# Patient Record
Sex: Male | Born: 1945 | Race: Black or African American | Hispanic: No | Marital: Single | Smoking: Current some day smoker
Health system: Southern US, Community
[De-identification: ages and names within clinical notes are randomized; demographics above are authoritative.]

## PROBLEM LIST (undated history)

## (undated) DIAGNOSIS — E119 Type 2 diabetes mellitus without complications: Secondary | ICD-10-CM

## (undated) DIAGNOSIS — E785 Hyperlipidemia, unspecified: Secondary | ICD-10-CM

## (undated) DIAGNOSIS — K219 Gastro-esophageal reflux disease without esophagitis: Secondary | ICD-10-CM

## (undated) DIAGNOSIS — I10 Essential (primary) hypertension: Secondary | ICD-10-CM

---

## 2008-07-29 ENCOUNTER — Emergency Department: Payer: Self-pay | Admitting: Emergency Medicine

## 2018-01-23 ENCOUNTER — Telehealth: Payer: Self-pay | Admitting: *Deleted

## 2018-01-23 NOTE — Telephone Encounter (Signed)
Called pt r/t ldct screening program, pt reports that he only smokes cigars once and a while.  Patient reports NEVER smoking cigarettes.  Patient is ineligible for scan, pt voiced understanding.

## 2018-08-29 ENCOUNTER — Encounter: Payer: Self-pay | Admitting: Family Medicine

## 2018-09-12 ENCOUNTER — Encounter: Payer: Self-pay | Admitting: Gastroenterology

## 2018-09-12 ENCOUNTER — Other Ambulatory Visit: Payer: Self-pay

## 2018-09-12 ENCOUNTER — Ambulatory Visit (INDEPENDENT_AMBULATORY_CARE_PROVIDER_SITE_OTHER): Payer: Self-pay | Admitting: Gastroenterology

## 2018-09-12 ENCOUNTER — Encounter (INDEPENDENT_AMBULATORY_CARE_PROVIDER_SITE_OTHER): Payer: Self-pay

## 2018-09-12 VITALS — BP 160/67 | HR 88 | Temp 98.1°F | Ht 65.0 in | Wt 182.0 lb

## 2018-09-12 DIAGNOSIS — E785 Hyperlipidemia, unspecified: Secondary | ICD-10-CM | POA: Insufficient documentation

## 2018-09-12 DIAGNOSIS — B351 Tinea unguium: Secondary | ICD-10-CM | POA: Insufficient documentation

## 2018-09-12 DIAGNOSIS — R748 Abnormal levels of other serum enzymes: Secondary | ICD-10-CM

## 2018-09-12 DIAGNOSIS — D649 Anemia, unspecified: Secondary | ICD-10-CM | POA: Insufficient documentation

## 2018-09-12 DIAGNOSIS — E875 Hyperkalemia: Secondary | ICD-10-CM | POA: Insufficient documentation

## 2018-09-12 DIAGNOSIS — D500 Iron deficiency anemia secondary to blood loss (chronic): Secondary | ICD-10-CM

## 2018-09-12 DIAGNOSIS — M7060 Trochanteric bursitis, unspecified hip: Secondary | ICD-10-CM | POA: Insufficient documentation

## 2018-09-12 DIAGNOSIS — I10 Essential (primary) hypertension: Secondary | ICD-10-CM | POA: Insufficient documentation

## 2018-09-12 DIAGNOSIS — M549 Dorsalgia, unspecified: Secondary | ICD-10-CM | POA: Insufficient documentation

## 2018-09-12 DIAGNOSIS — E669 Obesity, unspecified: Secondary | ICD-10-CM | POA: Insufficient documentation

## 2018-09-12 DIAGNOSIS — E119 Type 2 diabetes mellitus without complications: Secondary | ICD-10-CM | POA: Insufficient documentation

## 2018-09-12 NOTE — Progress Notes (Signed)
Gastroenterology Consultation  Referring Provider:     Marguerita Merles, MD Primary Care Physician:  Marguerita Merles, MD Primary Gastroenterologist:  Dr. Allen Norris     Reason for Consultation:     Anemia and abnormal liver enzymes        HPI:   Dale Jacobs is a 73 y.o. y/o male referred for consultation & management of anemia and abnormal liver enzymes by Dr. Lennox Grumbles, Connye Burkitt, MD.  This patient comes in today after being found to have a hemoglobin of 6.8 with a low MCV.  The patient was also noted to have an AST of 42 with the other liver enzymes being normal. The patient thinks he has had a colonoscopy in the past and states he says it was in somebody's office but cannot remember which office or the doctor's name.  He denies any black stools or bloody stools he also denies any hematuria.  The patient has not had any constipation or unexplained weight loss.  He does report that he believes one of his medications has caused his stools to be looser than they had been in the past. He denies any alcohol use or abuse.  He also denies any history of having abnormal liver enzymes in the past.  The patient has not had a work-up for abnormal liver enzymes in the past as per him.  He also denies any abdominal pain.  History reviewed. No pertinent past medical history.  History reviewed. No pertinent surgical history.  Prior to Admission medications   Not on File    History reviewed. No pertinent family history.   Social History   Tobacco Use  . Smoking status: Current Every Day Smoker  . Smokeless tobacco: Never Used  Substance Use Topics  . Alcohol use: Yes  . Drug use: Never    Allergies as of 09/12/2018 - Review Complete 09/12/2018  Allergen Reaction Noted  . Tradjenta [linagliptin]  09/12/2018    Review of Systems:    All systems reviewed and negative except where noted in HPI.   Physical Exam:  BP (!) 160/67   Pulse 88   Temp 98.1 F (36.7 C) (Oral)   Ht 5\' 5"  (1.651 m)   Wt  182 lb (82.6 kg)   BMI 30.29 kg/m  No LMP for male patient. General:   Alert,  Well-developed, well-nourished, pleasant and cooperative in NAD Head:  Normocephalic and atraumatic. Eyes:  Sclera clear, no icterus.   Conjunctiva pink. Ears:  Normal auditory acuity. Nose:  No deformity, discharge, or lesions. Mouth:  No deformity or lesions,oropharynx pink & moist. Neck:  Supple; no masses or thyromegaly. Lungs:  Respirations even and unlabored.  Clear throughout to auscultation.   No wheezes, crackles, or rhonchi. No acute distress. Heart:  Regular rate and rhythm; no murmurs, clicks, rubs, or gallops. Abdomen:  Normal bowel sounds.  No bruits.  Soft, non-tender and non-distended without masses, hepatosplenomegaly or hernias noted.  No guarding or rebound tenderness.  Negative Carnett sign.   Rectal:  Deferred.  Msk:  Symmetrical without gross deformities.  Good, equal movement & strength bilaterally. Pulses:  Normal pulses noted. Extremities:  No clubbing or edema.  No cyanosis. Neurologic:  Alert and oriented x3;  grossly normal neurologically. Skin:  Intact without significant lesions or rashes.  No jaundice. Lymph Nodes:  No significant cervical adenopathy. Psych:  Alert and cooperative. Normal mood and affect.  Imaging Studies: No results found.  Assessment and Plan:   Jaceyon  AKASH WINSKI is a 73 y.o. y/o male who comes in today with a history of anemia with a low MCV.  The patient is now on iron supplementation.  The patient will be set up for an EGD and colonoscopy due to his anemia.  The patient will also have lab sent off for possible causes of his abnormal liver enzymes.  He will also have a right upper quadrant ultrasound ordered to rule out fatty liver disease.  The patient denies any alcohol abuse and his liver enzymes showed an isolated increased AST.  The patient will be contacted with the results of his liver work-up and will follow-up at the time of the EGD and colonoscopy. I  have discussed risks & benefits which include, but are not limited to, bleeding, infection, perforation & drug reaction.  The patient agrees with this plan & written consent will be obtained.     Lucilla Lame, MD. Marval Regal    Note: This dictation was prepared with Dragon dictation along with smaller phrase technology. Any transcriptional errors that result from this process are unintentional.

## 2018-09-13 LAB — ANA: Anti Nuclear Antibody (ANA): NEGATIVE

## 2018-09-13 LAB — HEPATITIS A ANTIBODY, TOTAL: hep A Total Ab: POSITIVE — AB

## 2018-09-13 LAB — IRON AND TIBC
Iron Saturation: 8 % — CL (ref 15–55)
Iron: 34 ug/dL — ABNORMAL LOW (ref 38–169)
Total Iron Binding Capacity: 411 ug/dL (ref 250–450)
UIBC: 377 ug/dL — ABNORMAL HIGH (ref 111–343)

## 2018-09-13 LAB — CERULOPLASMIN: Ceruloplasmin: 24.2 mg/dL (ref 16.0–31.0)

## 2018-09-13 LAB — ANTI-SMOOTH MUSCLE ANTIBODY, IGG: Smooth Muscle Ab: 12 Units (ref 0–19)

## 2018-09-13 LAB — HEPATIC FUNCTION PANEL
ALT: 25 IU/L (ref 0–44)
AST: 37 IU/L (ref 0–40)
Albumin: 4.4 g/dL (ref 3.7–4.7)
Alkaline Phosphatase: 83 IU/L (ref 39–117)
Bilirubin Total: 0.3 mg/dL (ref 0.0–1.2)
Bilirubin, Direct: 0.12 mg/dL (ref 0.00–0.40)
Total Protein: 7.1 g/dL (ref 6.0–8.5)

## 2018-09-13 LAB — HEPATITIS B SURFACE ANTIGEN: Hepatitis B Surface Ag: NEGATIVE

## 2018-09-13 LAB — HEPATITIS B SURFACE ANTIBODY,QUALITATIVE: Hep B Surface Ab, Qual: NONREACTIVE

## 2018-09-13 LAB — ALPHA-1-ANTITRYPSIN: A-1 Antitrypsin: 120 mg/dL (ref 101–187)

## 2018-09-13 LAB — FERRITIN: Ferritin: 18 ng/mL — ABNORMAL LOW (ref 30–400)

## 2018-09-13 LAB — MITOCHONDRIAL ANTIBODIES: Mitochondrial Ab: <20 Units (ref 0.0–20.0)

## 2018-09-13 LAB — HEPATITIS C ANTIBODY: Hep C Virus Ab: 0.1 s/co ratio (ref 0.0–0.9)

## 2018-09-14 ENCOUNTER — Telehealth: Payer: Self-pay | Admitting: Gastroenterology

## 2018-09-14 NOTE — Telephone Encounter (Signed)
Patient called l/m on v/m that someone called him. I returned his call & per Sharyn Lull let him know to go for the Covid testing at [facility] on 09-29-18 between 1030-1230 to have the test. The patient wanted me to give all information to his sister.

## 2018-09-14 NOTE — Telephone Encounter (Signed)
Patient called stating he has a colonoscopy scheduled for 10-03-18 with Dr Allen Norris and someone called him about the covid test.

## 2018-09-28 ENCOUNTER — Ambulatory Visit: Payer: Self-pay | Admitting: Gastroenterology

## 2018-09-29 ENCOUNTER — Other Ambulatory Visit: Payer: Self-pay

## 2018-09-29 ENCOUNTER — Other Ambulatory Visit
Admission: RE | Admit: 2018-09-29 | Discharge: 2018-09-29 | Disposition: A | Discharge status: Home / Self Care | Payer: Medicare Other | Source: Ambulatory Visit | Attending: Gastroenterology | Admitting: Gastroenterology

## 2018-09-29 DIAGNOSIS — Z01812 Encounter for preprocedural laboratory examination: Secondary | ICD-10-CM | POA: Diagnosis present

## 2018-09-29 DIAGNOSIS — Z1159 Encounter for screening for other viral diseases: Secondary | ICD-10-CM | POA: Diagnosis not present

## 2018-09-29 LAB — SARS CORONAVIRUS 2 (TAT 6-24 HRS): SARS Coronavirus 2: NEGATIVE

## 2018-10-02 ENCOUNTER — Encounter: Payer: Self-pay | Admitting: Emergency Medicine

## 2018-10-03 ENCOUNTER — Other Ambulatory Visit: Payer: Self-pay

## 2018-10-03 ENCOUNTER — Encounter: Payer: Self-pay | Admitting: *Deleted

## 2018-10-03 ENCOUNTER — Ambulatory Visit
Admission: RE | Admit: 2018-10-03 | Discharge: 2018-10-03 | Disposition: A | Discharge status: Home / Self Care | Payer: Medicare Other | Attending: Gastroenterology | Admitting: Gastroenterology

## 2018-10-03 ENCOUNTER — Ambulatory Visit: Payer: Medicare Other | Admitting: Anesthesiology

## 2018-10-03 ENCOUNTER — Encounter
Admission: RE | Disposition: A | Discharge status: Home / Self Care | Payer: Self-pay | Source: Home / Self Care | Attending: Gastroenterology

## 2018-10-03 DIAGNOSIS — Z888 Allergy status to other drugs, medicaments and biological substances status: Secondary | ICD-10-CM | POA: Insufficient documentation

## 2018-10-03 DIAGNOSIS — D490 Neoplasm of unspecified behavior of digestive system: Secondary | ICD-10-CM | POA: Diagnosis not present

## 2018-10-03 DIAGNOSIS — K317 Polyp of stomach and duodenum: Secondary | ICD-10-CM

## 2018-10-03 DIAGNOSIS — D500 Iron deficiency anemia secondary to blood loss (chronic): Secondary | ICD-10-CM | POA: Diagnosis not present

## 2018-10-03 DIAGNOSIS — D122 Benign neoplasm of ascending colon: Secondary | ICD-10-CM

## 2018-10-03 DIAGNOSIS — K635 Polyp of colon: Secondary | ICD-10-CM

## 2018-10-03 DIAGNOSIS — D509 Iron deficiency anemia, unspecified: Secondary | ICD-10-CM | POA: Insufficient documentation

## 2018-10-03 DIAGNOSIS — F17200 Nicotine dependence, unspecified, uncomplicated: Secondary | ICD-10-CM | POA: Diagnosis not present

## 2018-10-03 DIAGNOSIS — K641 Second degree hemorrhoids: Secondary | ICD-10-CM | POA: Insufficient documentation

## 2018-10-03 DIAGNOSIS — E119 Type 2 diabetes mellitus without complications: Secondary | ICD-10-CM | POA: Insufficient documentation

## 2018-10-03 DIAGNOSIS — D649 Anemia, unspecified: Secondary | ICD-10-CM | POA: Diagnosis not present

## 2018-10-03 DIAGNOSIS — K269 Duodenal ulcer, unspecified as acute or chronic, without hemorrhage or perforation: Secondary | ICD-10-CM | POA: Insufficient documentation

## 2018-10-03 DIAGNOSIS — Z7984 Long term (current) use of oral hypoglycemic drugs: Secondary | ICD-10-CM | POA: Diagnosis not present

## 2018-10-03 DIAGNOSIS — I10 Essential (primary) hypertension: Secondary | ICD-10-CM | POA: Diagnosis not present

## 2018-10-03 DIAGNOSIS — K259 Gastric ulcer, unspecified as acute or chronic, without hemorrhage or perforation: Secondary | ICD-10-CM | POA: Insufficient documentation

## 2018-10-03 DIAGNOSIS — E785 Hyperlipidemia, unspecified: Secondary | ICD-10-CM | POA: Insufficient documentation

## 2018-10-03 DIAGNOSIS — Z79899 Other long term (current) drug therapy: Secondary | ICD-10-CM | POA: Diagnosis not present

## 2018-10-03 HISTORY — DX: Hyperlipidemia, unspecified: E78.5

## 2018-10-03 HISTORY — PX: COLONOSCOPY WITH PROPOFOL: SHX5780

## 2018-10-03 HISTORY — PX: ESOPHAGOGASTRODUODENOSCOPY (EGD) WITH PROPOFOL: SHX5813

## 2018-10-03 HISTORY — DX: Type 2 diabetes mellitus without complications: E11.9

## 2018-10-03 HISTORY — DX: Essential (primary) hypertension: I10

## 2018-10-03 LAB — GLUCOSE, CAPILLARY: Glucose-Capillary: 110 mg/dL — ABNORMAL HIGH (ref 70–99)

## 2018-10-03 SURGERY — COLONOSCOPY WITH PROPOFOL
Anesthesia: General

## 2018-10-03 MED ORDER — PROPOFOL 10 MG/ML IV BOLUS
INTRAVENOUS | Status: DC | PRN
  Administered 2018-10-03: 50 mg via INTRAVENOUS
  Administered 2018-10-03: 20 mg via INTRAVENOUS
  Administered 2018-10-03: 50 mg via INTRAVENOUS
  Administered 2018-10-03 (×6): 20 mg via INTRAVENOUS

## 2018-10-03 MED ORDER — EPHEDRINE SULFATE 50 MG/ML IJ SOLN
INTRAMUSCULAR | Status: AC
  Filled 2018-10-03: qty 2

## 2018-10-03 MED ORDER — LIDOCAINE HCL (PF) 2 % IJ SOLN
INTRAMUSCULAR | Status: AC
  Filled 2018-10-03: qty 30

## 2018-10-03 MED ORDER — SODIUM CHLORIDE 0.9 % IV SOLN
INTRAVENOUS | Status: DC
  Administered 2018-10-03: 08:00:00 via INTRAVENOUS

## 2018-10-03 MED ORDER — GLYCOPYRROLATE 0.2 MG/ML IJ SOLN
INTRAMUSCULAR | Status: DC | PRN
  Administered 2018-10-03: 0.2 mg via INTRAVENOUS

## 2018-10-03 MED ORDER — LIDOCAINE HCL (PF) 2 % IJ SOLN
INTRAMUSCULAR | Status: AC
  Filled 2018-10-03: qty 40

## 2018-10-03 MED ORDER — LIDOCAINE HCL (PF) 2 % IJ SOLN
INTRAMUSCULAR | Status: AC
  Filled 2018-10-03: qty 10

## 2018-10-03 MED ORDER — PROPOFOL 500 MG/50ML IV EMUL
INTRAVENOUS | Status: AC
  Filled 2018-10-03: qty 500

## 2018-10-03 MED ORDER — GLYCOPYRROLATE 0.2 MG/ML IJ SOLN
INTRAMUSCULAR | Status: AC
  Filled 2018-10-03: qty 3

## 2018-10-03 MED ORDER — GLYCOPYRROLATE 0.2 MG/ML IJ SOLN
INTRAMUSCULAR | Status: AC
  Filled 2018-10-03: qty 4

## 2018-10-03 MED ORDER — PHENYLEPHRINE HCL (PRESSORS) 10 MG/ML IV SOLN
INTRAVENOUS | Status: AC
  Filled 2018-10-03: qty 1

## 2018-10-03 MED ORDER — SUCCINYLCHOLINE CHLORIDE 20 MG/ML IJ SOLN
INTRAMUSCULAR | Status: AC
  Filled 2018-10-03: qty 1

## 2018-10-03 MED ORDER — PROPOFOL 500 MG/50ML IV EMUL
INTRAVENOUS | Status: AC
  Filled 2018-10-03: qty 100

## 2018-10-03 MED ORDER — LIDOCAINE HCL (CARDIAC) PF 100 MG/5ML IV SOSY
PREFILLED_SYRINGE | INTRAVENOUS | Status: DC | PRN
  Administered 2018-10-03: 100 mg via INTRATRACHEAL

## 2018-10-03 MED ORDER — PROPOFOL 10 MG/ML IV BOLUS
INTRAVENOUS | Status: AC
  Filled 2018-10-03: qty 20

## 2018-10-03 MED ORDER — PHENYLEPHRINE HCL (PRESSORS) 10 MG/ML IV SOLN
INTRAVENOUS | Status: DC | PRN
  Administered 2018-10-03: 100 ug via INTRAVENOUS

## 2018-10-03 MED ORDER — SUGAMMADEX SODIUM 500 MG/5ML IV SOLN
INTRAVENOUS | Status: AC
  Filled 2018-10-03: qty 5

## 2018-10-03 MED ORDER — ROCURONIUM BROMIDE 50 MG/5ML IV SOLN
INTRAVENOUS | Status: AC
  Filled 2018-10-03: qty 1

## 2018-10-03 NOTE — Op Note (Signed)
[facility] Gastroenterology Patient Name: Dale Jacobs Procedure Date: 10/03/2018 8:15 AM MRN: [id number] Account #: [id number] Date of Birth: 04-12-1945 Admit Type: Outpatient Age: 73 Room: [facility] ENDO ROOM 4 Gender: Male Note Status: Finalized Procedure:            Upper GI endoscopy Indications:          Iron deficiency anemia Providers:            Lucilla Lame MD, MD Referring MD:         No Local Md, MD (Referring MD) Medicines:            Propofol per Anesthesia Complications:        No immediate complications. Procedure:            Pre-Anesthesia Assessment:                       - Prior to the procedure, a History and Physical was                        performed, and patient medications and allergies were                        reviewed. The patient's tolerance of previous                        anesthesia was also reviewed. The risks and benefits of                        the procedure and the sedation options and risks were                        discussed with the patient. All questions were                        answered, and informed consent was obtained. Prior                        Anticoagulants: The patient has taken no previous                        anticoagulant or antiplatelet agents. ASA Grade                        Assessment: III - A patient with severe systemic                        disease. After reviewing the risks and benefits, the                        patient was deemed in satisfactory condition to undergo                        the procedure.                       After obtaining informed consent, the endoscope was                        passed under direct vision. Throughout the procedure,  the patient's blood pressure, pulse, and oxygen                        saturations were monitored continuously. The Endoscope                        was introduced through the mouth, and advanced to the        second part of duodenum. The upper GI endoscopy was                        accomplished without difficulty. The patient tolerated                        the procedure well. Findings:      The examined esophagus was normal.      One non-bleeding superficial gastric ulcer with pigmented material was       found in the gastric body.      Multiple 8 mm pedunculated polyps with no bleeding were found in the       duodenal bulb and in the second portion of the duodenum. The polyp was       removed with a cold biopsy forceps. Resection and retrieval were       complete. To prevent bleeding post-intervention, one hemostatic clip was       successfully placed (MR conditional). There was no bleeding at the end       of the procedure. Impression:           - Normal esophagus.                       - Non-bleeding gastric ulcer with pigmented material.                       - Multiple duodenal polyps. Resected and retrieved.                        Clip (MR conditional) was placed. Recommendation:       - Discharge patient to home.                       - Resume previous diet.                       - Continue present medications.                       - Await pathology results.                       - Perform a colonoscopy today. Procedure Code(s):    --- Professional ---                       [phone], Esophagogastroduodenoscopy, flexible, transoral;                        with biopsy, single or multiple Diagnosis Code(s):    --- Professional ---                       D50.9, Iron deficiency anemia, unspecified  K31.7, Polyp of stomach and duodenum                       K25.9, Gastric ulcer, unspecified as acute or chronic,                        without hemorrhage or perforation CPT copyright 2019 American Medical Association. All rights reserved. The codes documented in this report are preliminary and upon coder review may  be revised to meet current compliance  requirements. Lucilla Lame MD, MD 10/03/2018 8:27:23 AM This report has been signed electronically. Number of Addenda: 0 Note Initiated On: 10/03/2018 8:15 AM Estimated Blood Loss: Estimated blood loss: none.      [facility]

## 2018-10-03 NOTE — Anesthesia Preprocedure Evaluation (Addendum)
Anesthesia Evaluation  Patient identified by MRN, date of birth, ID band Patient awake    Reviewed: Allergy & Precautions, NPO status , Patient's Chart, lab work & pertinent test results, reviewed documented beta blocker date and time   Airway Mallampati: II  TM Distance: >3 FB     Dental  (+) Upper Dentures, Lower Dentures   Pulmonary Current Smoker,           Cardiovascular hypertension,      Neuro/Psych    GI/Hepatic   Endo/Other  diabetes, Type 2  Renal/GU      Musculoskeletal   Abdominal   Peds  Hematology  (+) anemia ,   Anesthesia Other Findings   Reproductive/Obstetrics                            Anesthesia Physical Anesthesia Plan  ASA: III  Anesthesia Plan: General   Post-op Pain Management:    Induction: Intravenous  PONV Risk Score and Plan:   Airway Management Planned:   Additional Equipment:   Intra-op Plan:   Post-operative Plan:   Informed Consent: I have reviewed the patients History and Physical, chart, labs and discussed the procedure including the risks, benefits and alternatives for the proposed anesthesia with the patient or authorized representative who has indicated his/her understanding and acceptance.       Plan Discussed with: CRNA  Anesthesia Plan Comments:         Anesthesia Quick Evaluation

## 2018-10-03 NOTE — H&P (Signed)
Lucilla Lame, MD [address] [place], [place] [address] Phone:[phone] Fax : [phone]  Primary Care Physician:  Marguerita Merles, MD Primary Gastroenterologist:  Dr. Allen Norris  Pre-Procedure History & Physical: HPI:  Dale WOODMANSEE is a 73 y.o. male is here for an colonoscopy and EGD.   Past Medical History:  Diagnosis Date  . Diabetes mellitus without complication ([place])   . Hyperlipemia   . Hypertension     Past Surgical History:  Procedure Laterality Date  . no surgical history      Prior to Admission medications   Medication Sig Start Date End Date Taking? Authorizing Provider  Accu-Chek FastClix Lancets MISC  04/07/18  Yes [provider]  ferrous sulfate 325 (65 FE) MG tablet Take 325 mg by mouth daily with breakfast.   Yes [provider]  GLIPIZIDE XL 10 MG 24 hr tablet  09/07/18  Yes [provider]  hydrochlorothiazide (HYDRODIURIL) 25 MG tablet  09/07/18  Yes [provider]  lisinopril (ZESTRIL) 40 MG tablet  08/01/18  Yes [provider]  magnesium oxide (MAG-OX) 400 MG tablet Take 400 mg by mouth daily.   Yes [provider]  metFORMIN (GLUCOPHAGE) 500 MG tablet  09/07/18  Yes [provider]  atorvastatin (LIPITOR) 40 MG tablet  08/01/18   [provider]    Allergies as of 09/12/2018 - Review Complete 09/12/2018  Allergen Reaction Noted  . Tradjenta [linagliptin]  09/12/2018    History reviewed. No pertinent family history.  Social History   Socioeconomic History  . Marital status: Single    Spouse name: Not on file  . Number of children: Not on file  . Years of education: Not on file  . Highest education level: Not on file  Occupational History  . Not on file  Social Needs  . Financial resource strain: Not on file  . Food insecurity    Worry: Not on file    Inability: Not on file  . Transportation needs    Medical: Not on file    Non-medical: Not on file   Tobacco Use  . Smoking status: Current Some Day Smoker  . Smokeless tobacco: Never Used  Substance and Sexual Activity  . Alcohol use: Yes  . Drug use: Never  . Sexual activity: Not on file  Lifestyle  . Physical activity    Days per week: Not on file    Minutes per session: Not on file  . Stress: Not on file  Relationships  . Social Herbalist on phone: Not on file    Gets together: Not on file    Attends religious service: Not on file    Active member of club or organization: Not on file    Attends meetings of clubs or organizations: Not on file    Relationship status: Not on file  . Intimate partner violence    Fear of current or ex partner: Not on file    Emotionally abused: Not on file    Physically abused: Not on file    Forced sexual activity: Not on file  Other Topics Concern  . Not on file  Social History Narrative  . Not on file    Review of Systems: See HPI, otherwise negative ROS  Physical Exam: BP 131/72   Pulse 72   Temp (!) 96.6 F (35.9 C) (Tympanic)   Resp 18   Ht 5\' 5"  (1.651 m)   Wt 79.4 kg  SpO2 100%   BMI 29.12 kg/m  General:   Alert,  pleasant and cooperative in NAD Head:  Normocephalic and atraumatic. Neck:  Supple; no masses or thyromegaly. Lungs:  Clear throughout to auscultation.    Heart:  Regular rate and rhythm. Abdomen:  Soft, nontender and nondistended. Normal bowel sounds, without guarding, and without rebound.   Neurologic:  Alert and  oriented x4;  grossly normal neurologically.  Impression/Plan: Ferry is here for an endoscopy and colonoscopy to be performed for IDA  Risks, benefits, limitations, and alternatives regarding  endoscopy and colonoscopy have been reviewed with the patient.  Questions have been answered.  All parties agreeable.   Lucilla Lame, MD  10/03/2018, 8:10 AM

## 2018-10-03 NOTE — Anesthesia Post-op Follow-up Note (Signed)
Anesthesia QCDR form completed.        

## 2018-10-03 NOTE — Anesthesia Postprocedure Evaluation (Signed)
Anesthesia Post Note  Patient: Dale Jacobs  Procedure(s) Performed: COLONOSCOPY WITH PROPOFOL (N/A ) ESOPHAGOGASTRODUODENOSCOPY (EGD) WITH PROPOFOL (N/A )  Patient location during evaluation: Endoscopy Anesthesia Type: General Level of consciousness: awake and alert Pain management: pain level controlled Vital Signs Assessment: post-procedure vital signs reviewed and stable Respiratory status: spontaneous breathing, nonlabored ventilation, respiratory function stable and patient connected to nasal cannula oxygen Cardiovascular status: blood pressure returned to baseline and stable Postop Assessment: no apparent nausea or vomiting Anesthetic complications: no     Last Vitals:  Vitals:   10/03/18 0900 10/03/18 0910  BP: 117/75 114/75  Pulse: 84 89  Resp: 20 (!) 22  Temp:    SpO2: 98% 94%    Last Pain:  Vitals:   10/03/18 0910  TempSrc:   PainSc: 0-No pain                 Harvest Deist S

## 2018-10-03 NOTE — Op Note (Signed)
[facility] Gastroenterology Patient Name: Dale Jacobs Procedure Date: 10/03/2018 8:14 AM MRN: [id number] Account #: [id number] Date of Birth: 11-13-1945 Admit Type: Outpatient Age: 73 Room: [facility] ENDO ROOM 4 Gender: Male Note Status: Finalized Procedure:            Colonoscopy Indications:          Iron deficiency anemia Providers:            Lucilla Lame MD, MD Referring MD:         No Local Md, MD (Referring MD) Medicines:            Propofol per Anesthesia Complications:        No immediate complications. Procedure:            Pre-Anesthesia Assessment:                       - Prior to the procedure, a History and Physical was                        performed, and patient medications and allergies were                        reviewed. The patient's tolerance of previous                        anesthesia was also reviewed. The risks and benefits of                        the procedure and the sedation options and risks were                        discussed with the patient. All questions were                        answered, and informed consent was obtained. Prior                        Anticoagulants: The patient has taken no previous                        anticoagulant or antiplatelet agents. ASA Grade                        Assessment: II - A patient with mild systemic disease.                        After reviewing the risks and benefits, the patient was                        deemed in satisfactory condition to undergo the                        procedure.                       After obtaining informed consent, the colonoscope was                        passed under direct vision. Throughout the procedure,  the patient's blood pressure, pulse, and oxygen                        saturations were monitored continuously. The                        Colonoscope was introduced through the anus and                        advanced to the  the cecum, identified by appendiceal                        orifice and ileocecal valve. The colonoscopy was                        performed without difficulty. The patient tolerated the                        procedure well. The quality of the bowel preparation                        was good. Findings:      The perianal and digital rectal examinations were normal.      A 3 mm polyp was found in the ascending colon. The polyp was sessile.       The polyp was removed with a cold biopsy forceps. Resection and       retrieval were complete.      Non-bleeding internal hemorrhoids were found during retroflexion. The       hemorrhoids were Grade II (internal hemorrhoids that prolapse but reduce       spontaneously). Impression:           - One 3 mm polyp in the ascending colon, removed with a                        cold biopsy forceps. Resected and retrieved.                       - Non-bleeding internal hemorrhoids. Recommendation:       - Discharge patient to home.                       - Resume previous diet.                       - Continue present medications.                       - Await pathology results.                       - Repeat colonoscopy in 5 years if polyp adenoma and 10                        years if hyperplastic Procedure Code(s):    --- Professional ---                       [phone], Colonoscopy, flexible; with biopsy, single or                        multiple Diagnosis Code(s):    ---  Professional ---                       D50.9, Iron deficiency anemia, unspecified                       K63.5, Polyp of colon CPT copyright 2019 American Medical Association. All rights reserved. The codes documented in this report are preliminary and upon coder review may  be revised to meet current compliance requirements. Lucilla Lame MD, MD 10/03/2018 8:38:59 AM This report has been signed electronically. Number of Addenda: 0 Note Initiated On: 10/03/2018 8:14 AM Scope Withdrawal  Time: 0 hours 5 minutes 58 seconds  Total Procedure Duration: 0 hours 7 minutes 45 seconds  Estimated Blood Loss: Estimated blood loss: none.      [facility]

## 2018-10-03 NOTE — Transfer of Care (Signed)
Immediate Anesthesia Transfer of Care Note  Patient: Dale Jacobs  Procedure(s) Performed: COLONOSCOPY WITH PROPOFOL (N/A ) ESOPHAGOGASTRODUODENOSCOPY (EGD) WITH PROPOFOL (N/A )  Patient Location: Endoscopy Unit  Anesthesia Type:General  Level of Consciousness: drowsy and responds to stimulation  Airway & Oxygen Therapy: Patient Spontanous Breathing and Patient connected to face mask oxygen  Post-op Assessment: Report given to RN and Post -op Vital signs reviewed and stable  Post vital signs: Reviewed and stable  Last Vitals:  Vitals Value Taken Time  BP    Temp    Pulse    Resp    SpO2      Last Pain:  Vitals:   10/03/18 0744  TempSrc: Tympanic  PainSc: 0-No pain         Complications: No apparent anesthesia complications

## 2018-10-04 ENCOUNTER — Encounter: Payer: Self-pay | Admitting: Gastroenterology

## 2018-10-10 ENCOUNTER — Telehealth: Payer: Self-pay

## 2018-10-10 NOTE — Telephone Encounter (Signed)
Pt notified of lab results

## 2018-10-10 NOTE — Telephone Encounter (Signed)
Left message for pt to return my call.

## 2018-10-10 NOTE — Telephone Encounter (Signed)
-----   Message from Lucilla Lame, MD sent at 10/05/2018  6:38 AM EDT ----- Let the patient know that his blood work came back showing that he needs a vaccination for hepatitis B but not for hepatitis A.  His liver enzymes have come back to normal.  He does have anemia likely due to the findings on his upper endoscopy and he should avoid anti-inflammatory medication.  The patient should be started on daily iron supplementation and have his blood count checked again in 1 month.

## 2018-10-17 ENCOUNTER — Encounter: Payer: Self-pay | Admitting: Gastroenterology

## 2018-10-17 ENCOUNTER — Other Ambulatory Visit: Payer: Self-pay

## 2018-10-17 ENCOUNTER — Ambulatory Visit (INDEPENDENT_AMBULATORY_CARE_PROVIDER_SITE_OTHER): Payer: Medicare Other | Admitting: Gastroenterology

## 2018-10-17 ENCOUNTER — Encounter (INDEPENDENT_AMBULATORY_CARE_PROVIDER_SITE_OTHER): Payer: Self-pay

## 2018-10-17 VITALS — BP 141/61 | HR 73 | Temp 98.2°F | Ht 65.0 in | Wt 178.6 lb

## 2018-10-17 DIAGNOSIS — D500 Iron deficiency anemia secondary to blood loss (chronic): Secondary | ICD-10-CM

## 2018-10-17 NOTE — Progress Notes (Addendum)
Primary Care Physician: Marguerita Merles, MD  Primary Gastroenterologist:  Dr. Lucilla Lame  Chief Complaint  Patient presents with  . follow up procedure results    HPI: Trayden R Aguino is a 73 y.o. male here for follow-up after having an EGD and colonoscopy for anemia.  The patient had biopsies of polypoid lesions in the duodenum that showed:  DIAGNOSIS:  A. 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   While the colon polyp taken off the ascending colon was a tubular adenoma.  The patient reports that he is feeling well without any complaints.  He states that he is still taking his iron tablets.  Current Outpatient Medications  Medication Sig Dispense Refill  . Accu-Chek FastClix Lancets MISC     . atorvastatin (LIPITOR) 40 MG tablet     . ferrous sulfate 325 (65 FE) MG tablet Take 325 mg by mouth daily with breakfast.    . GLIPIZIDE XL 10 MG 24 hr tablet     . hydrochlorothiazide (HYDRODIURIL) 25 MG tablet     . lisinopril (ZESTRIL) 40 MG tablet     . magnesium oxide (MAG-OX) 400 MG tablet Take 400 mg by mouth daily.    . metFORMIN (GLUCOPHAGE) 500 MG tablet      No current facility-administered medications for this visit.     Allergies as of 10/17/2018 - Review Complete 10/17/2018  Allergen Reaction Noted  . Tradjenta [linagliptin]  09/12/2018    ROS:  General: Negative for anorexia, weight loss, fever, chills, fatigue, weakness. ENT: Negative for hoarseness, difficulty swallowing , nasal congestion. CV: Negative for chest pain, angina, palpitations, dyspnea on exertion, peripheral edema.  Respiratory: Negative for dyspnea at rest, dyspnea on exertion, cough, sputum, wheezing.  GI: See history of present illness. GU:  Negative for dysuria, hematuria, urinary incontinence, urinary frequency, nocturnal urination.  Endo: Negative for unusual weight change.    Physical Examination:   BP (!) 141/61   Pulse 73   Temp  98.2 F (36.8 C) (Oral)   Ht 5\' 5"  (1.651 m)   Wt 178 lb 9.6 oz (81 kg)   BMI 29.72 kg/m   General: Well-nourished, well-developed in no acute distress.  Eyes: No icterus. Conjunctivae pink. Mouth: Oropharyngeal mucosa moist and pink , no lesions erythema or exudate. Lungs: Clear to auscultation bilaterally. Non-labored. Heart: Regular rate and rhythm, no murmurs rubs or gallops.  Abdomen: Bowel sounds are normal, nontender, nondistended, no hepatosplenomegaly or masses, no abdominal bruits or hernia , no rebound or guarding.   Extremities: No lower extremity edema. No clubbing or deformities. Neuro: Alert and oriented x 3.  Grossly intact. Skin: Warm and dry, no jaundice.   Psych: Alert and cooperative, normal mood and affect.  Labs:    Imaging Studies: No results found.  Assessment and Plan:   Deonta RIDDICK NUON is a 73 y.o. y/o male who comes in today for follow-up after having biopsies of multiple polyps seen in the duodenum.  The polyps were shown to be well-differentiated neuroendocrine tumors.  The patient also had an EGD and colonoscopy for iron deficiency anemia which did not show a cause of any active bleeding therefore he will be set up for a capsule endoscopy to look for any source of bleeding in the small bowel and for any further tumors.  The patient will need these lesions removed and I have sent a message to Dr. Rush Landmark in [place] to see if he would  see the patient for removal of these lesions.  At the request of Dr. Rush Landmark I will also obtain a CT scan of the abdomen and pelvis and a chromogranin A level.  The patient has been explained the plan and agrees with it.    Lucilla Lame, MD. Marval Regal   Note: This dictation was prepared with Dragon dictation along with smaller phrase technology. Any transcriptional errors that result from this process are unintentional.

## 2018-10-19 ENCOUNTER — Other Ambulatory Visit: Payer: Self-pay

## 2018-10-19 ENCOUNTER — Telehealth: Payer: Self-pay

## 2018-10-19 DIAGNOSIS — R933 Abnormal findings on diagnostic imaging of other parts of digestive tract: Secondary | ICD-10-CM

## 2018-10-19 NOTE — Telephone Encounter (Signed)
Pt has been scheduled for a CT scan abdomen/pelvis at [facility] on Friday, July 31st at [place] contacting pt but no answer or voicemail to leave a message.

## 2018-10-20 ENCOUNTER — Telehealth: Payer: Self-pay | Admitting: Gastroenterology

## 2018-10-20 NOTE — Telephone Encounter (Signed)
Dr Rush Landmark are we ordering the capsule endo?

## 2018-10-20 NOTE — Telephone Encounter (Signed)
Patty, Dr. Allen Norris and I did speak about this patient. 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 After our discussion we wanted to pursue a CTAP and he was working on ordering a Video Capsule Endoscopy. Dependent on these results will dictate whether an endoscopic approach would be necessary if the burden is not too significant in regards to the nodules vs surgical intervention. I would go ahead and plan to look for a date in 4-6 weeks and get it on the books, so that we can have all of this information back and then finalize moving forward or not. Dr. Allen Norris was arranging the CT and the VCE. Thank you. GM

## 2018-10-20 NOTE — Telephone Encounter (Signed)
Please see other notes. As we discussed, the patient does not need a virtual colonoscopy but a video capsule endoscopy and a CT scan of the abd and pelvis as we discussed.We also need a Chromagranin A sent of the patient.

## 2018-10-20 NOTE — Telephone Encounter (Signed)
Can you call the referring and make sure the CT scan and virtual colonoscopy have been scheduled and we get a copy.  Dr Rush Landmark will decide at that point if procedure is needed.

## 2018-10-20 NOTE — Telephone Encounter (Signed)
Dr Mansouraty please advise  

## 2018-10-20 NOTE — Telephone Encounter (Signed)
Hi Patty, we have received a referral from Dr. Allen Norris At [facility] for removal of lesions due to abnormal colon. The referral mentions that Dr. Allen Norris has already spoken with Dr. Rush Landmark about it. Please advise on scheduling.

## 2018-10-20 NOTE — Telephone Encounter (Signed)
Hi Patty, Pls see notes from Dr. Allen Norris below. I see that pt is scheduled for Ct scan on 7/31 at 8:00am.

## 2018-10-20 NOTE — Telephone Encounter (Signed)
Video Capsule Endoscopy to be ordered by Dr. Allen Norris. Thanks. GM

## 2018-10-23 ENCOUNTER — Other Ambulatory Visit: Payer: Self-pay

## 2018-10-23 DIAGNOSIS — R933 Abnormal findings on diagnostic imaging of other parts of digestive tract: Secondary | ICD-10-CM

## 2018-10-23 NOTE — Telephone Encounter (Signed)
Pt has been scheduled for a [place] Capsule study at [facility] on Monday, August 10th. Left message for pt to return my call to discuss these upcoming appt's.

## 2018-10-23 NOTE — Telephone Encounter (Signed)
FYI.. Dr. Rush Landmark: see below  Pt returned my call and advised me he did not want to go to these appts. He doesn't have the money to pay for them and was told he would have to pay every time he went. I advised pt the importance of the CT scan and capsule study prior to scheduling with Dr.Mansouraty  and he still refused to go. I will not cancel these appts yet. I will inform Dr. Allen Norris his intentions and have him contact pt to discuss.

## 2018-10-23 NOTE — Telephone Encounter (Signed)
Can you please make sure we get a copy of the CT scan and the Capsule Endo for review from Dr Dorothey Baseman office

## 2018-10-23 NOTE — Telephone Encounter (Signed)
Pt left vm returning a call °

## 2018-10-23 NOTE — Telephone Encounter (Signed)
Thank you for update. GM 

## 2018-10-25 NOTE — Telephone Encounter (Signed)
Please have a certified letter with his signed receipt drawn up saying that he has multiple cancerous growth and we have recommended removal after further investigations including further blood tests and CT scans. We understand he has voiced his unwillingness to proceed any further with a work up or removal but he needs to know that this can grow, spread and end his life.

## 2018-10-26 NOTE — Telephone Encounter (Signed)
Left vm for pt to return my call to readdress the need for his appointments.

## 2018-10-27 ENCOUNTER — Ambulatory Visit: Payer: Medicare Other

## 2018-10-30 NOTE — Telephone Encounter (Signed)
Pt returned my call and I went over Dr. Dorothey Baseman recommendation again. Pt stated he did not want anybody cutting on him and he wanted a second opinion. I advised him he was being referred to Dr. Rush Landmark to discuss his options for removing the multiple cancerous growths. Pt still stated he did not want do this because he was 73 years old.   A certified letter will be mailed to pt.

## 2018-10-31 LAB — SURGICAL PATHOLOGY

## 2018-11-01 ENCOUNTER — Other Ambulatory Visit: Payer: Self-pay

## 2018-11-01 DIAGNOSIS — R933 Abnormal findings on diagnostic imaging of other parts of digestive tract: Secondary | ICD-10-CM

## 2018-11-02 ENCOUNTER — Other Ambulatory Visit: Admission: RE | Admit: 2018-11-02 | Payer: Medicare Other | Source: Ambulatory Visit

## 2018-11-06 ENCOUNTER — Encounter
Admission: RE | Disposition: A | Discharge status: Home / Self Care | Payer: Self-pay | Source: Home / Self Care | Attending: Gastroenterology

## 2018-11-06 ENCOUNTER — Other Ambulatory Visit: Payer: Self-pay

## 2018-11-06 ENCOUNTER — Ambulatory Visit
Admission: RE | Admit: 2018-11-06 | Discharge: 2018-11-06 | Disposition: A | Discharge status: Home / Self Care | Payer: Medicare Other | Attending: Gastroenterology | Admitting: Gastroenterology

## 2018-11-06 DIAGNOSIS — D3A8 Other benign neuroendocrine tumors: Secondary | ICD-10-CM | POA: Insufficient documentation

## 2018-11-06 DIAGNOSIS — K317 Polyp of stomach and duodenum: Secondary | ICD-10-CM | POA: Diagnosis present

## 2018-11-06 DIAGNOSIS — D509 Iron deficiency anemia, unspecified: Secondary | ICD-10-CM

## 2018-11-06 HISTORY — PX: GIVENS CAPSULE STUDY: SHX5432

## 2018-11-06 SURGERY — IMAGING PROCEDURE, GI TRACT, INTRALUMINAL, VIA CAPSULE

## 2018-11-06 NOTE — Telephone Encounter (Signed)
Pt had called me back and I discussed again the need to get these multiple cancerous growths removed. Pt has agreed to move forward with the Capsule study and CT scan that has been scheduled this week.

## 2018-11-07 ENCOUNTER — Encounter: Payer: Self-pay | Admitting: Gastroenterology

## 2018-11-08 ENCOUNTER — Ambulatory Visit
Admission: RE | Admit: 2018-11-08 | Discharge: 2018-11-08 | Disposition: A | Discharge status: Home / Self Care | Payer: Medicare Other | Source: Ambulatory Visit | Attending: Gastroenterology | Admitting: Gastroenterology

## 2018-11-08 ENCOUNTER — Ambulatory Visit: Payer: Medicare Other

## 2018-11-08 ENCOUNTER — Other Ambulatory Visit: Payer: Self-pay

## 2018-11-08 DIAGNOSIS — R933 Abnormal findings on diagnostic imaging of other parts of digestive tract: Secondary | ICD-10-CM | POA: Diagnosis not present

## 2018-11-08 LAB — POCT I-STAT CREATININE: Creatinine, Ser: 1.2 mg/dL (ref 0.61–1.24)

## 2018-11-08 MED ORDER — IOHEXOL 300 MG/ML  SOLN
100.0000 mL | Freq: Once | INTRAMUSCULAR | Status: AC | PRN
  Administered 2018-11-08: 100 mL via INTRAVENOUS

## 2018-11-13 ENCOUNTER — Telehealth: Payer: Self-pay

## 2018-11-13 NOTE — Telephone Encounter (Signed)
Thanks for update. GM 

## 2018-11-13 NOTE — Telephone Encounter (Signed)
-----   Message from Irving Copas., MD sent at 11/13/2018 12:06 PM EDT ----- Regarding: Follow-up Darren,Thanks for sending me the CT scan results. There are some lymph nodes that are in the region, however it would probably be reasonable for [place] to consider trying to sample those at time of endoscopic ultrasound and possible EMR the carcinoids thereafter.   I think it would be worthwhile for [place] to move forward with getting the patient on the schedule for an EUS with possible EMR (at least 2-hour slot) since I may need to sample the lymph nodes as well.Tametra Ahart please set up a telemedicine visit or in person visit.Darren, if we can get the patient to do the chromogranin level (that you have ordered) as well that would help guide [place] because if it was significantly elevated, I may just pursue sampling attempt of the lymph nodes to ensure that he does not have disease outside of the area already.Danasha Melman, please let us know once you have set up the clinic visit and look for a potential date for his procedure.Thanks.GM ----- Message ----- From: Lucilla Lame, MD Sent: 11/09/2018   6:35 PM EDT To: Irving Copas., MD   ----- Message ----- From: Interface, Rad Results In Sent: 11/08/2018  11:10 PM EDT To: Lucilla Lame, MD

## 2018-11-13 NOTE — Telephone Encounter (Signed)
Appointment has been made for 12/19/18 at 9:10 am to discuss EUS-EMR.  Letter mailed to the pt with appt information.

## 2018-11-17 ENCOUNTER — Telehealth: Payer: Self-pay

## 2018-11-17 NOTE — Telephone Encounter (Signed)
-----   Message from Timothy Lasso, RN sent at 10/20/2018 11:41 AM EDT ----- Can you call the referring and make sure the CT scan and virtual colonoscopy have been scheduled and we get a copy.  Dr Rush Landmark will decide at that point if procedure is needed.   Mansouraty, Telford Nab., MD to Me  Thelma Barge, Catherin Maudie Mercury, MD     10/20/18 11:26 AM Note    Dale Jacobs, Dr. Allen Norris and I did speak about this patient. 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 After our discussion we wanted to pursue a CTAP and he was working on ordering a Video Capsule Endoscopy. Dependent on these results will dictate whether an endoscopic approach would be necessary if the burden is not too significant in regards to the nodules vs surgical intervention. I would go ahead and plan to look for a date in 4-6 weeks and get it on the books, so that we can have all of this information back and then finalize moving forward or not. Dr. Allen Norris was arranging the CT and the VCE. Thank you. GM       10/20/18 11:20 AM You routed this conversation to Mansouraty, Telford Nab., MD  Me     10/20/18 11:20 AM Note    Dr Rush Landmark please advise       10/20/18 11:16 AM Sedano Daine Gip S routed this conversation to Me  Thelma Barge, Catherin S     10/20/18 11:16 AM Note    Hi [name], we have received a referral from Dr. Allen Norris At [facility] for removal of lesions due to abnormal colon. The referral mentions that Dr. Allen Norris has already spoken with Dr. Rush Landmark about it. Please advise on scheduling.   Lucilla Lame, MD to Thelma Barge, [place]

## 2018-11-17 NOTE — Telephone Encounter (Signed)
Barbie Haggis can you check on this for me since Tye [place] is out?  "Can you call the referring and make sure the CT scan and virtual colonoscopy have been scheduled and we get a copy.  Dr Rush Landmark will decide at that point if procedure is needed."

## 2018-12-19 ENCOUNTER — Ambulatory Visit: Payer: Medicare Other | Admitting: Gastroenterology

## 2018-12-20 ENCOUNTER — Encounter: Payer: Self-pay | Admitting: Gastroenterology

## 2019-02-08 ENCOUNTER — Other Ambulatory Visit: Payer: Self-pay

## 2019-02-08 ENCOUNTER — Encounter: Payer: Self-pay | Admitting: Gastroenterology

## 2019-02-08 ENCOUNTER — Ambulatory Visit (INDEPENDENT_AMBULATORY_CARE_PROVIDER_SITE_OTHER): Payer: Medicare Other | Admitting: Gastroenterology

## 2019-02-08 ENCOUNTER — Other Ambulatory Visit: Payer: Medicare Other

## 2019-02-08 VITALS — BP 132/82 | HR 76 | Temp 97.8°F | Ht 64.0 in | Wt 177.1 lb

## 2019-02-08 DIAGNOSIS — D132 Benign neoplasm of duodenum: Secondary | ICD-10-CM

## 2019-02-08 DIAGNOSIS — C7A8 Other malignant neuroendocrine tumors: Secondary | ICD-10-CM | POA: Diagnosis not present

## 2019-02-08 DIAGNOSIS — K317 Polyp of stomach and duodenum: Secondary | ICD-10-CM

## 2019-02-08 NOTE — Progress Notes (Signed)
[facility] VISIT   Primary Care Provider Marguerita Merles, [place] [id number] [phone]  Referring Provider Dr. Allen Norris  Patient Profile: Dale Jacobs is a 73 y.o. male with a pmh significant for diabetes, hypertension, hyperlipidemia, colon polyps (adenomas), obesity, duodenal neuroendocrine tumors.  The patient presents to the [facility] for an evaluation and management of problem(s) noted below:  Problem List 1. 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 Polyp of duodenum     History of Present Illness This is a patient who is followed by Dr. Allen Norris from a GI perspective.  He recently underwent an endoscopy and colonoscopy in July 2020 for the work-up of iron deficiency anemia.  Results showed a normal esophagus, a nonbleeding superficial gastric ulcer, as well as multiple 8 mm pedunculated polyps that were sampled via forceps.  The biopsies showed evidence of neuroendocrine cells.  He underwent a CT abdomen/pelvis for evaluation to ensure that he did not have significant disease outside of the area and results suggested findings of some mildly enlarged lymph nodes but no evidence of overt metastatic disease.  Further CT findings are noted below.  The patient was referred in early fall but the patient did not schedule clinic visit until now.  Today, the patient states he is doing okay.  He wants to minimize procedures if possible.  He has no significant other GI complaints other than he has quite a few medications and wonders if any of his medications are causing him to have or develop these lesions.  He wonders if there is any sort of medication that can be taken to just have these lesions removed medically without having any further endoscopies.  GI Review of Systems Positive as above Negative for dysphagia, odynophagia, early satiety, change in bowel habits, melena, hematochezia  Review of Systems  General: Denies fevers/chills/weight loss HEENT: Denies oral lesions Cardiovascular: Denies chest pain Pulmonary: Denies shortness of breath Gastroenterological: See HPI Genitourinary: Denies darkened urine or hematuria Hematological: Denies easy bruising/bleeding Endocrine: Denies temperature intolerance Dermatological: Denies jaundice Psychological: Mood is stable but he does have anxiety about further procedures   Medications Current Outpatient Medications  Medication Sig Dispense Refill  . Accu-Chek FastClix Lancets MISC     . atorvastatin (LIPITOR) 40 MG tablet     . ferrous sulfate 325 (65 FE) MG tablet Take 325 mg by mouth daily with breakfast.    . GLIPIZIDE XL 10 MG 24 hr tablet     . hydrochlorothiazide (HYDRODIURIL) 25 MG tablet     . lisinopril (ZESTRIL) 40 MG tablet     . magnesium oxide (MAG-OX) 400 MG tablet Take 400 mg by mouth daily.    . metFORMIN (GLUCOPHAGE) 500 MG tablet      No current facility-administered medications for this visit.     Allergies Allergies  Allergen Reactions  . Tradjenta [Linagliptin]     Histories Past Medical History:  Diagnosis Date  . Diabetes mellitus without complication ([place])   . Hyperlipemia   . Hypertension    Past Surgical History:  Procedure Laterality Date  . COLONOSCOPY WITH PROPOFOL N/A 10/03/2018   Procedure: COLONOSCOPY WITH PROPOFOL;  Surgeon: Lucilla Lame, MD;  Location[facility] ENDOSCOPY;  Service: Endoscopy;  Laterality: N/A;  . ESOPHAGOGASTRODUODENOSCOPY (EGD) WITH PROPOFOL N/A 10/03/2018   Procedure: ESOPHAGOGASTRODUODENOSCOPY (EGD) WITH PROPOFOL;  Surgeon: Lucilla Lame, MD;  Location: [facility] ENDOSCOPY;  Service: Endoscopy;  Laterality: N/A;  . GIVENS CAPSULE STUDY N/A  11/06/2018   Procedure: GIVENS CAPSULE STUDY;  Surgeon: Lucilla Lame, MD;  Location[facility] ENDOSCOPY;  Service: Endoscopy;  Laterality: N/A;  . no surgical history     Social History   Socioeconomic History  . Marital status: Single    Spouse  name: Not on file  . Number of children: Not on file  . Years of education: Not on file  . Highest education level: Not on file  Occupational History  . Not on file  Social Needs  . Financial resource strain: Not on file  . Food insecurity    Worry: Not on file    Inability: Not on file  . Transportation needs    Medical: Not on file    Non-medical: Not on file  Tobacco Use  . Smoking status: Current Some Day Smoker  . Smokeless tobacco: Never Used  Substance and Sexual Activity  . Alcohol use: Never    Frequency: Never  . Drug use: Not Currently  . Sexual activity: Not on file  Lifestyle  . Physical activity    Days per week: Not on file    Minutes per session: Not on file  . Stress: Not on file  Relationships  . Social Herbalist on phone: Not on file    Gets together: Not on file    Attends religious service: Not on file    Active member of club or organization: Not on file    Attends meetings of clubs or organizations: Not on file    Relationship status: Not on file  . Intimate partner violence    Fear of current or ex partner: Not on file    Emotionally abused: Not on file    Physically abused: Not on file    Forced sexual activity: Not on file  Other Topics Concern  . Not on file  Social History Narrative  . Not on file   Family History  Problem Relation Age of Onset  . Heart disease Father   . Colon cancer Neg Hx   . Liver cancer Neg Hx   . Pancreatic cancer Neg Hx   . Prostate cancer Neg Hx   . Rectal cancer Neg Hx   . Stomach cancer Neg Hx   . Inflammatory bowel disease Neg Hx   . Esophageal cancer Neg Hx    I have reviewed his medical, social, and family history in detail and updated the electronic medical record as necessary.    PHYSICAL EXAMINATION  BP 132/82 (BP Location: Left Arm, Patient Position: Sitting, Cuff Size: Normal)   Pulse 76   Temp 97.8 F (36.6 C) (Oral)   Ht _0  (1.626 m)   Wt 177 lb 2 oz (80.3 kg)   BMI 30.40  kg/m  Wt Readings from Last 3 Encounters:  02/08/19 177 lb 2 oz (80.3 kg)  10/17/18 178 lb 9.6 oz (81 kg)  10/03/18 175 lb (79.4 kg)  GEN: NAD, appears stated age, doesn't appear chronically ill PSYCH: Cooperative, without pressured speech EYE: Conjunctivae pink, sclerae anicteric ENT: MMM CV: RR without R/Gs  RESP: CTAB posteriorly, without wheezing GI: NABS, soft, rounded, NT/ND, without rebound or guarding, no HSM appreciated MSK/EXT: No lower extremity edema SKIN: No jaundice NEURO:  Alert & Oriented x 3, no focal deficits   REVIEW OF DATA  I reviewed the following data at the time of this encounter:  GI Procedures and Studies  July 2020 EGD - Normal esophagus. - Non-bleeding gastric ulcer with  pigmented material. - Multiple duodenal polyps. Resected and retrieved. Clip (MR conditional) was placed. DIAGNOSIS:  A. 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  Comment: Multiple additional deeper recut levels were examined.  Immunohistochemical studies with appropriately reactive controls show  tumor cells to be positive for chromogranin, synaptophysin, and CD56,  with S100 marking sustentacular cells. Ki-67 marks approximately 1.8% of  tumor cells by 500 cell manual count.  There is significant histologic and immunohistochemical overlap between  well-differentiated neuroendocrine tumors and gangliocytic  paraganglioma. To that end, a progesterone receptor stain (positive in  GP, negative in NET) was ordered, and is negative.   Laboratory Studies  Reviewed those in epic  Imaging Studies  August 2020 CTAP IMPRESSION: Mildly thickened proximal duodenal fold without discrete mass on CT. No colonic wall thickening or mass is seen. No evidence of bowel obstruction. Normal appendix. Capsule endoscopy camera within the rectum. Small upper abdominal/retroperitoneal lymph nodes, most of which are within normal limits. A 15 mm short  axis peripancreatic node is mildly enlarged, although nonspecific/indeterminate. Cholelithiasis, without associated inflammatory changes. Micronodular hepatic contour, raising the possibility of early cirrhosis.   ASSESSMENT  Dale Jacobs is a 73 y.o. male with a pmh significant for diabetes, hypertension, hyperlipidemia, colon polyps (adenomas), obesity, duodenal neuroendocrine tumors.  The patient is seen today for evaluation and management of:  1. 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 Polyp of duodenum    The patient is hemodynamically and clinically stable.  Based upon the description and endoscopic pictures I do feel that it is reasonable to pursue an Advanced resection attempt of the duodenal neuroendocrine tumors.  This is in the hospices that an endoscopic ultrasound does not show evidence of disease in the slightly enlarged lymph nodes that were noted on his CT scan.   We discussed some of the techniques of advanced resection which include Endoscopic Mucosal Resection and OVESCO Full-Thickness Resection (just approved by FDA for upper GI tract but not available at our center).  The risks and benefits of endoscopic evaluation were discussed with the patient because these are precancerous lesions; these include but are not limited to the risk of perforation, infection, bleeding, missed lesions, lack of diagnosis, severe illness requiring hospitalization, as well as anesthesia and sedation related illnesses.  During attempts at advanced resection, the risks of bleeding and perforation/leak are increased as opposed to diagnostic and screening colonoscopies, and that was discussed with the patient as well.  If, after attempt at removal of the lesions, it is found that the patient has a complication or that an invasive lesion or a more malignant lesion is found, the patient is aware and understands that surgery may still be indicated/required.  The risks of EUS including bleeding,  infection, aspiration pneumonia and intestinal perforation were discussed as was the possibility it may not give a definitive diagnosis.  If a biopsy of the pancreas is done as part of the EUS, there is an additional risk of pancreatitis at the rate of about 1%.  It was explained that procedure related pancreatitis is typically mild, although can be severe and even life threatening, which is why we do not perform random pancreatic biopsies and only biopsy a lesion we feel is concerning enough to warrant the risk.  All patient questions were answered, to the best of my ability.  The patient is unsure of what he wants to do but is leaning towards a resection but does not want  to agree to this as of yet.  I have continue to reiterate the role of our potential endoscopic resection as long as we do not find evidence on endoscopic ultrasound of more concerning disease.  These are precancerous lesions and small intestine neuroendocrine tumors have a higher likelihood of metastatic disease in the long-term.  However, there are some benign lesions that may never be an issue for patient.  I have given him my card and we have written these instructions and information for the patient and he will let us know in the next couple of weeks what his decision is.  I will update Dr. Allen Norris about the patient's hesitancy on moving forward with procedures for now.  We will reach out to him in a couple of weeks if he has not call us back.  I do think getting a chromogranin will be helpful.   PLAN  Obtain chromogranin A level Hopefully hear back from patient in 2 weeks or we will call in 2 weeks to see what his decision is in regards to moving forward with EGD/EUS with EMR If he decides to move forward with that he will need to obtain a CBC/BMP/INR and that can be obtained closer to home in [place] if he agrees to move forward with things at least 6 to 8 weeks prior to a procedure His referring provider will be updated about the  patient's decision at this time   Orders Placed This Encounter  Procedures  . Chromogranin A    New Prescriptions   No medications on file   Modified Medications   No medications on file    Planned Follow Up No follow-ups on file.   [name], MD [facility] Advanced Endoscopy Office # [phone]

## 2019-02-08 NOTE — Progress Notes (Signed)
du

## 2019-02-08 NOTE — Patient Instructions (Signed)
Your provider has requested that you go to the basement level for lab work before leaving today. Press "B" on the elevator. The lab is located at the first door on the left as you exit the elevator.  It has been recommended to you by your physician that you have a(n) EUS+EMR at hospital  completed. Per your request, we did not schedule the procedure(s) today. Please contact our office at [phone] should you decide to have the procedure completed. You will be scheduled for a pre-visit and procedure at that time.  Thank you for choosing me and [facility].  Dr. Rush Landmark

## 2019-02-09 DIAGNOSIS — C7A8 Other malignant neuroendocrine tumors: Secondary | ICD-10-CM | POA: Insufficient documentation

## 2019-02-12 LAB — CHROMOGRANIN A: Chromogranin A: 113 ng/mL (ref 25–140)

## 2019-04-04 ENCOUNTER — Telehealth: Payer: Self-pay | Admitting: Gastroenterology

## 2019-04-04 DIAGNOSIS — R933 Abnormal findings on diagnostic imaging of other parts of digestive tract: Secondary | ICD-10-CM

## 2019-04-04 DIAGNOSIS — C7A8 Other malignant neuroendocrine tumors: Secondary | ICD-10-CM

## 2019-04-04 DIAGNOSIS — K317 Polyp of stomach and duodenum: Secondary | ICD-10-CM

## 2019-04-04 DIAGNOSIS — R748 Abnormal levels of other serum enzymes: Secondary | ICD-10-CM

## 2019-04-04 DIAGNOSIS — D500 Iron deficiency anemia secondary to blood loss (chronic): Secondary | ICD-10-CM

## 2019-04-04 NOTE — Telephone Encounter (Signed)
"  Obtain chromogranin A level Hopefully hear back from patient in 2 weeks or we will call in 2 weeks to see what his decision is in regards to moving forward with EGD/EUS with EMR If he decides to move forward with that he will need to obtain a CBC/BMP/INR and that can be obtained closer to home in [place] if he agrees to move forward with things at least 6 to 8 weeks prior to a procedure His referring provider will be updated about the patient's decision at this time"

## 2019-04-04 NOTE — Telephone Encounter (Signed)
Left message on machine to call back  

## 2019-04-05 NOTE — Telephone Encounter (Signed)
The pt has been advised to come in for labs and we can get him set up for procedure in about 6-8 weeks.  He will come in at his convenience.

## 2019-04-20 ENCOUNTER — Other Ambulatory Visit (INDEPENDENT_AMBULATORY_CARE_PROVIDER_SITE_OTHER): Payer: Medicare Other

## 2019-04-20 DIAGNOSIS — R933 Abnormal findings on diagnostic imaging of other parts of digestive tract: Secondary | ICD-10-CM

## 2019-04-20 DIAGNOSIS — D500 Iron deficiency anemia secondary to blood loss (chronic): Secondary | ICD-10-CM | POA: Diagnosis not present

## 2019-04-20 DIAGNOSIS — K317 Polyp of stomach and duodenum: Secondary | ICD-10-CM

## 2019-04-20 DIAGNOSIS — C7A8 Other malignant neuroendocrine tumors: Secondary | ICD-10-CM

## 2019-04-20 DIAGNOSIS — R748 Abnormal levels of other serum enzymes: Secondary | ICD-10-CM

## 2019-04-20 LAB — CBC WITH DIFFERENTIAL/PLATELET
Basophils Absolute: 0 10*3/uL (ref 0.0–0.1)
Basophils Relative: 0.5 % (ref 0.0–3.0)
Eosinophils Absolute: 0.2 10*3/uL (ref 0.0–0.7)
Eosinophils Relative: 3 % (ref 0.0–5.0)
HCT: 39.5 % (ref 39.0–52.0)
Hemoglobin: 13 g/dL (ref 13.0–17.0)
Lymphocytes Relative: 26 % (ref 12.0–46.0)
Lymphs Abs: 2 10*3/uL (ref 0.7–4.0)
MCHC: 33 g/dL (ref 30.0–36.0)
MCV: 91.9 fl (ref 78.0–100.0)
Monocytes Absolute: 0.7 10*3/uL (ref 0.1–1.0)
Monocytes Relative: 9.2 % (ref 3.0–12.0)
Neutro Abs: 4.7 10*3/uL (ref 1.4–7.7)
Neutrophils Relative %: 61.3 % (ref 43.0–77.0)
Platelets: 278 10*3/uL (ref 150.0–400.0)
RBC: 4.3 Mil/uL (ref 4.22–5.81)
RDW: 13.8 % (ref 11.5–15.5)
WBC: 7.7 10*3/uL (ref 4.0–10.5)

## 2019-04-20 LAB — PROTIME-INR
INR: 1.1 ratio — ABNORMAL HIGH (ref 0.8–1.0)
Prothrombin Time: 13.1 s (ref 9.6–13.1)

## 2019-04-20 LAB — BASIC METABOLIC PANEL
BUN: 14 mg/dL (ref 6–23)
CO2: 27 mEq/L (ref 19–32)
Calcium: 9.5 mg/dL (ref 8.4–10.5)
Chloride: 105 mEq/L (ref 96–112)
Creatinine, Ser: 0.93 mg/dL (ref 0.40–1.50)
GFR: 96.37 mL/min (ref >60.00)
Glucose, Bld: 133 mg/dL — ABNORMAL HIGH (ref 70–99)
Potassium: 3.7 mEq/L (ref 3.5–5.1)
Sodium: 141 mEq/L (ref 135–145)

## 2019-04-24 ENCOUNTER — Other Ambulatory Visit: Payer: Self-pay

## 2019-04-24 DIAGNOSIS — K317 Polyp of stomach and duodenum: Secondary | ICD-10-CM

## 2019-04-24 DIAGNOSIS — C7A8 Other malignant neuroendocrine tumors: Secondary | ICD-10-CM

## 2019-04-24 DIAGNOSIS — R933 Abnormal findings on diagnostic imaging of other parts of digestive tract: Secondary | ICD-10-CM

## 2019-04-24 DIAGNOSIS — D500 Iron deficiency anemia secondary to blood loss (chronic): Secondary | ICD-10-CM

## 2019-04-24 DIAGNOSIS — R748 Abnormal levels of other serum enzymes: Secondary | ICD-10-CM

## 2019-05-11 ENCOUNTER — Other Ambulatory Visit: Payer: Self-pay

## 2019-05-11 ENCOUNTER — Encounter (HOSPITAL_COMMUNITY): Payer: Self-pay | Admitting: Gastroenterology

## 2019-05-11 ENCOUNTER — Other Ambulatory Visit: Payer: Medicare Other

## 2019-05-11 ENCOUNTER — Other Ambulatory Visit (HOSPITAL_COMMUNITY)
Admission: RE | Admit: 2019-05-11 | Discharge: 2019-05-11 | Disposition: A | Discharge status: Home / Self Care | Payer: Medicare Other | Source: Ambulatory Visit | Attending: Gastroenterology | Admitting: Gastroenterology

## 2019-05-11 DIAGNOSIS — Z20822 Contact with and (suspected) exposure to covid-19: Secondary | ICD-10-CM | POA: Diagnosis not present

## 2019-05-11 DIAGNOSIS — Z01812 Encounter for preprocedural laboratory examination: Secondary | ICD-10-CM | POA: Insufficient documentation

## 2019-05-11 LAB — SARS CORONAVIRUS 2 (TAT 6-24 HRS): SARS Coronavirus 2: NEGATIVE

## 2019-05-11 NOTE — Progress Notes (Signed)
Pre op call for endo procedure on Monday 2/15. Patient had COVID test today and states he will remain in quarantine over weekend. Patient states sometimes smokes cigars and will refrain for 24 hrs. Patient to arrive to hospital by 0630 am. All questions addressed.

## 2019-05-13 ENCOUNTER — Encounter (HOSPITAL_COMMUNITY): Payer: Self-pay | Admitting: Anesthesiology

## 2019-05-13 NOTE — Anesthesia Preprocedure Evaluation (Deleted)
Anesthesia Evaluation    Reviewed: Allergy & Precautions, Patient's Chart, lab work & pertinent test results  Airway        Dental   Pulmonary Current Smoker,           Cardiovascular hypertension, Pt. on medications      Neuro/Psych negative neurological ROS     GI/Hepatic Neg liver ROS, GERD  Medicated,  Endo/Other  diabetes, Oral Hypoglycemic Agents  Renal/GU negative Renal ROS     Musculoskeletal negative musculoskeletal ROS (+)   Abdominal   Peds  Hematology HLD   Anesthesia Other Findings duodenal lesion  Reproductive/Obstetrics                             Anesthesia Physical Anesthesia Plan  ASA: III  Anesthesia Plan:    Post-op Pain Management:    Induction: Intravenous  PONV Risk Score and Plan:   Airway Management Planned:   Additional Equipment:   Intra-op Plan:   Post-operative Plan:   Informed Consent:   Plan Discussed with:   Anesthesia Plan Comments:         Anesthesia Quick Evaluation

## 2019-05-14 ENCOUNTER — Telehealth: Payer: Self-pay | Admitting: Gastroenterology

## 2019-05-14 ENCOUNTER — Other Ambulatory Visit: Payer: Self-pay

## 2019-05-14 ENCOUNTER — Ambulatory Visit (HOSPITAL_COMMUNITY): Admission: RE | Admit: 2019-05-14 | Payer: Medicare Other | Source: Home / Self Care | Admitting: Gastroenterology

## 2019-05-14 DIAGNOSIS — K317 Polyp of stomach and duodenum: Secondary | ICD-10-CM

## 2019-05-14 HISTORY — DX: Gastro-esophageal reflux disease without esophagitis: K21.9

## 2019-05-14 SURGERY — ESOPHAGOGASTRODUODENOSCOPY (EGD) WITH PROPOFOL
Anesthesia: Monitor Anesthesia Care

## 2019-05-14 NOTE — Telephone Encounter (Signed)
Procedure is for duodenal lesion

## 2019-05-14 NOTE — Telephone Encounter (Signed)
Morey Hummingbird from [place] called in reference to this patient said the hospital at [facility] asked she speak with you.

## 2019-05-14 NOTE — Telephone Encounter (Signed)
The pt has been rescheduled to 06/20/19 at 830 am [facility] COVID test rescheduled to 3/20 at 49 am.  Morey Hummingbird with social services has been advised and instructions mailed to the pt home.

## 2019-05-14 NOTE — Telephone Encounter (Signed)
The pt needs to be rescheduled for EGD, EUS, EMR.  Call [facility] at [phone]

## 2019-06-07 ENCOUNTER — Telehealth: Payer: Self-pay | Admitting: Gastroenterology

## 2019-06-07 NOTE — Telephone Encounter (Signed)
The pt was contacted and advised that the appt was required by the hospital for COVID testing.  He states he will get a ride and have the test completed as requested.

## 2019-06-07 NOTE — Telephone Encounter (Signed)
I spoke with the pt and he tells me that he has found a ride and to leave the appt as planned.

## 2019-06-07 NOTE — Telephone Encounter (Signed)
Patient is calling states he spoke with someone from social services and they are not open on Saturday or the bus does not run on Saturdays not sure which one it was a bit difficult to understand the patient.

## 2019-06-07 NOTE — Telephone Encounter (Signed)
Pt is scheduled for a hosp procedure.  He stated that he cannot have covid test on Saturday due to lack of transportation.

## 2019-06-16 ENCOUNTER — Other Ambulatory Visit (HOSPITAL_COMMUNITY): Payer: Self-pay

## 2019-06-16 ENCOUNTER — Other Ambulatory Visit (HOSPITAL_COMMUNITY)
Admission: RE | Admit: 2019-06-16 | Discharge: 2019-06-16 | Disposition: A | Discharge status: Home / Self Care | Payer: Medicare Other | Source: Ambulatory Visit | Attending: Gastroenterology | Admitting: Gastroenterology

## 2019-06-16 DIAGNOSIS — Z20822 Contact with and (suspected) exposure to covid-19: Secondary | ICD-10-CM | POA: Diagnosis not present

## 2019-06-16 DIAGNOSIS — Z01812 Encounter for preprocedural laboratory examination: Secondary | ICD-10-CM | POA: Diagnosis present

## 2019-06-16 LAB — SARS CORONAVIRUS 2 (TAT 6-24 HRS): SARS Coronavirus 2: NEGATIVE

## 2019-06-19 NOTE — Anesthesia Preprocedure Evaluation (Addendum)
Anesthesia Evaluation  Patient identified by MRN, date of birth, ID band Patient awake    Reviewed: Allergy & Precautions, NPO status , Patient's Chart, lab work & pertinent test results, reviewed documented beta blocker date and time   Airway Mallampati: II  TM Distance: >3 FB Neck ROM: Full    Dental no notable dental hx. (+) Upper Dentures, Lower Dentures   Pulmonary neg pulmonary ROS, Current Smoker,    Pulmonary exam normal breath sounds clear to auscultation       Cardiovascular hypertension, Pt. on medications Normal cardiovascular exam Rhythm:Regular Rate:Normal     Neuro/Psych negative psych ROS   GI/Hepatic Neg liver ROS, GERD  Medicated,  Endo/Other  diabetes, Type 2Morbid obesity  Renal/GU negative Renal ROS  negative genitourinary   Musculoskeletal negative musculoskeletal ROS (+)   Abdominal   Peds  Hematology  (+) Blood dyscrasia, anemia ,   Anesthesia Other Findings   Reproductive/Obstetrics negative OB ROS                            Anesthesia Physical  Anesthesia Plan  ASA: III  Anesthesia Plan: General   Post-op Pain Management:    Induction: Intravenous  PONV Risk Score and Plan: 2 and Ondansetron, Treatment may vary due to age or medical condition and Midazolam  Airway Management Planned: Oral ETT  Additional Equipment:   Intra-op Plan:   Post-operative Plan: Extubation in OR  Informed Consent: I have reviewed the patients History and Physical, chart, labs and discussed the procedure including the risks, benefits and alternatives for the proposed anesthesia with the patient or authorized representative who has indicated his/her understanding and acceptance.       Plan Discussed with: CRNA, Anesthesiologist and Surgeon  Anesthesia Plan Comments: (  )       Anesthesia Quick Evaluation

## 2019-06-20 ENCOUNTER — Other Ambulatory Visit: Payer: Self-pay

## 2019-06-20 ENCOUNTER — Ambulatory Visit (HOSPITAL_COMMUNITY): Payer: Medicare Other | Admitting: Certified Registered"

## 2019-06-20 ENCOUNTER — Encounter (HOSPITAL_COMMUNITY)
Admission: RE | Disposition: A | Discharge status: Home / Self Care | Payer: Self-pay | Source: Home / Self Care | Attending: Gastroenterology

## 2019-06-20 ENCOUNTER — Encounter (HOSPITAL_COMMUNITY): Payer: Self-pay | Admitting: Gastroenterology

## 2019-06-20 ENCOUNTER — Ambulatory Visit (HOSPITAL_COMMUNITY)
Admission: RE | Admit: 2019-06-20 | Discharge: 2019-06-20 | Disposition: A | Discharge status: Home / Self Care | Payer: Medicare Other | Attending: Gastroenterology | Admitting: Gastroenterology

## 2019-06-20 DIAGNOSIS — I10 Essential (primary) hypertension: Secondary | ICD-10-CM | POA: Diagnosis not present

## 2019-06-20 DIAGNOSIS — K317 Polyp of stomach and duodenum: Secondary | ICD-10-CM | POA: Diagnosis not present

## 2019-06-20 DIAGNOSIS — E119 Type 2 diabetes mellitus without complications: Secondary | ICD-10-CM | POA: Diagnosis not present

## 2019-06-20 DIAGNOSIS — K319 Disease of stomach and duodenum, unspecified: Secondary | ICD-10-CM | POA: Diagnosis present

## 2019-06-20 DIAGNOSIS — F17290 Nicotine dependence, other tobacco product, uncomplicated: Secondary | ICD-10-CM | POA: Insufficient documentation

## 2019-06-20 DIAGNOSIS — D132 Benign neoplasm of duodenum: Secondary | ICD-10-CM | POA: Diagnosis not present

## 2019-06-20 DIAGNOSIS — K2970 Gastritis, unspecified, without bleeding: Secondary | ICD-10-CM

## 2019-06-20 DIAGNOSIS — Z6829 Body mass index (BMI) 29.0-29.9, adult: Secondary | ICD-10-CM | POA: Insufficient documentation

## 2019-06-20 DIAGNOSIS — D3A010 Benign carcinoid tumor of the duodenum: Secondary | ICD-10-CM | POA: Insufficient documentation

## 2019-06-20 DIAGNOSIS — K219 Gastro-esophageal reflux disease without esophagitis: Secondary | ICD-10-CM | POA: Insufficient documentation

## 2019-06-20 HISTORY — PX: ESOPHAGOGASTRODUODENOSCOPY (EGD) WITH PROPOFOL: SHX5813

## 2019-06-20 HISTORY — PX: BIOPSY: SHX5522

## 2019-06-20 HISTORY — PX: POLYPECTOMY: SHX5525

## 2019-06-20 HISTORY — PX: EUS: SHX5427

## 2019-06-20 LAB — GLUCOSE, CAPILLARY
Glucose-Capillary: 90 mg/dL (ref 70–99)
Glucose-Capillary: 157 mg/dL — ABNORMAL HIGH (ref 70–99)

## 2019-06-20 SURGERY — ESOPHAGOGASTRODUODENOSCOPY (EGD) WITH PROPOFOL
Anesthesia: General

## 2019-06-20 MED ORDER — PHENYLEPHRINE 40 MCG/ML (10ML) SYRINGE FOR IV PUSH (FOR BLOOD PRESSURE SUPPORT)
PREFILLED_SYRINGE | INTRAVENOUS | Status: DC | PRN
  Administered 2019-06-20 (×4): 40 ug via INTRAVENOUS

## 2019-06-20 MED ORDER — SUGAMMADEX SODIUM 200 MG/2ML IV SOLN
INTRAVENOUS | Status: DC | PRN
  Administered 2019-06-20: 160 mg via INTRAVENOUS

## 2019-06-20 MED ORDER — PROPOFOL 10 MG/ML IV BOLUS
INTRAVENOUS | Status: DC | PRN
  Administered 2019-06-20: 110 mg via INTRAVENOUS

## 2019-06-20 MED ORDER — SODIUM CHLORIDE 0.9 % IV SOLN: INTRAVENOUS | Status: DC

## 2019-06-20 MED ORDER — ONDANSETRON HCL 4 MG/2ML IJ SOLN
INTRAMUSCULAR | Status: DC | PRN
  Administered 2019-06-20: 4 mg via INTRAVENOUS

## 2019-06-20 MED ORDER — ROCURONIUM BROMIDE 10 MG/ML (PF) SYRINGE
PREFILLED_SYRINGE | INTRAVENOUS | Status: DC | PRN
  Administered 2019-06-20: 60 mg via INTRAVENOUS
  Administered 2019-06-20: 10 mg via INTRAVENOUS

## 2019-06-20 MED ORDER — LACTATED RINGERS IV SOLN
INTRAVENOUS | Status: DC
  Administered 2019-06-20: 08:00:00 1000 mL via INTRAVENOUS

## 2019-06-20 MED ORDER — OMEPRAZOLE 40 MG PO CPDR: 40.0000 mg | DELAYED_RELEASE_CAPSULE | Freq: Every day | ORAL | 4 refills | Status: AC

## 2019-06-20 MED ORDER — FENTANYL CITRATE (PF) 250 MCG/5ML IJ SOLN
INTRAMUSCULAR | Status: DC | PRN
  Administered 2019-06-20: 25 ug via INTRAVENOUS
  Administered 2019-06-20: 50 ug via INTRAVENOUS

## 2019-06-20 MED ORDER — PROPOFOL 10 MG/ML IV BOLUS
INTRAVENOUS | Status: AC
  Filled 2019-06-20: qty 20

## 2019-06-20 MED ORDER — FENTANYL CITRATE (PF) 100 MCG/2ML IJ SOLN
INTRAMUSCULAR | Status: AC
  Filled 2019-06-20: qty 2

## 2019-06-20 MED ORDER — PROPOFOL 500 MG/50ML IV EMUL
INTRAVENOUS | Status: AC
  Filled 2019-06-20: qty 50

## 2019-06-20 MED ORDER — LIDOCAINE 2% (20 MG/ML) 5 ML SYRINGE
INTRAMUSCULAR | Status: DC | PRN
  Administered 2019-06-20: 40 mg via INTRAVENOUS

## 2019-06-20 MED ORDER — PHENYLEPHRINE HCL-NACL 10-0.9 MG/250ML-% IV SOLN
INTRAVENOUS | Status: DC | PRN
  Administered 2019-06-20: 20 ug/min via INTRAVENOUS

## 2019-06-20 MED ORDER — DEXAMETHASONE SODIUM PHOSPHATE 10 MG/ML IJ SOLN
INTRAMUSCULAR | Status: DC | PRN
  Administered 2019-06-20: 4 mg via INTRAVENOUS

## 2019-06-20 SURGICAL SUPPLY — 15 items

## 2019-06-20 NOTE — Op Note (Signed)
[facility] Patient Name: Dale Jacobs Procedure Date: 06/20/2019 MRN: [id number] Attending MD: [name] , MD Date of Birth: 1945-03-30 CSN: [id number] Age: 74 Admit Type: Outpatient Procedure:                Upper EUS Indications:              Duodenal mucosal mass/polyp found on endoscopy,                            Submucosal tumor versus extrinsic mass found on                            endoscopy, Neuroendocrine Tumor for potential                            resection Providers:                [name], MD, Burtis Junes, RN, Elspeth Cho Tech., Technician, Jefm Miles Referring MD:             Lucilla Lame MD, MD, Marguerita Merles, MD Medicines:                General Anesthesia Complications:            No immediate complications. Estimated Blood Loss:     Estimated blood loss was minimal. Procedure:                Pre-Anesthesia Assessment:                           - Prior to the procedure, a History and Physical                            was performed, and patient medications and                            allergies were reviewed. The patient's tolerance of                            previous anesthesia was also reviewed. The risks                            and benefits of the procedure and the sedation                            options and risks were discussed with the patient.                            All questions were answered, and informed consent                            was obtained. Prior Anticoagulants: The patient has  taken no previous anticoagulant or antiplatelet                            agents except for aspirin. ASA Grade Assessment: II                            - A patient with mild systemic disease. After                            reviewing the risks and benefits, the patient was                            deemed in satisfactory condition to undergo the                      procedure.                           After obtaining informed consent, the endoscope was                            passed under direct vision. Throughout the                            procedure, the patient's blood pressure, pulse, and                            oxygen saturations were monitored continuously. The                            [id number] ([id number]) Olympus therapeutic endoscope                            was introduced through the mouth, and advanced to                            the second part of duodenum. The Duodenoscope was                            introduced through the mouth, and advanced to the                            second part of duodenum. The [id number] ([id number])                            Olympus Radial EUS was introduced through the                            mouth, and advanced to the duodenum for ultrasound                            examination from the stomach and duodenum. The  upper EUS was accomplished without difficulty. The                            patient tolerated the procedure. Scope In: Scope Out: Findings:      ENDOSCOPIC FINDING: :      No gross lesions were noted in the entire esophagus.      The Z-line was regular and was found 35 cm from the incisors.      Scattered mild inflammation characterized by erythema and linear       erosions was found in the cardia, in the gastric body, at the incisura       and in the gastric antrum. Biopsies were taken with a cold forceps for       histology and Helicobacter pylori testing.      A single 5 mm sessile polyp was found in the second portion of the       duodenum. The polyp was removed with a cold snare. Resection and       retrieval were complete.      Ten, 5 to 16 mm submucosal nodules were found in the duodenal bulb, in       the first portion of the duodenum and in the second portion of the       duodenum. After the EUS was completed, decision  made due to the likely       multifocal nature of the area to proceed with biopsies. Biopsies were       taken with a cold forceps for histology from nodule 1. Biopsies were       taken with a cold forceps for histology from nodule 2. Biopsies were       taken with a cold forceps for histology from nodule 3. Biopsies were       taken with a cold forceps for histology from nodule 4. Biopsies were       taken with a cold forceps for histology from nodule 5. Biopsies were       taken with a cold forceps for histology from nodule 6. Biopsies were       taken with a cold forceps for histology from nodule 7. Biopsies were       taken with a cold forceps for histology from nodule 8. Biopsies were       taken with a cold forceps for histology from nodule 9. Biopsies were       taken with a cold forceps for histology from nodule 10.      ENDOSONOGRAPHIC FINDING: :      A round intramural (subepithelial) lesions were found in the duodenal       bulb, in the apex of the duodenal bulb and in the second portion of the       duodenum. The lesions were hypoechoic. Endosonographically, the lesions       appeared to originate from within the deep mucosa (Layer 2) and       submucosa (Layer 3). The lesion measured 11.2 mm x 9.0 mm, 9.9 mm x 7.6       mm, 9.3 mm x 11.6 mm, 10.1 mm x 11.3 mm, and 13.1 x 7.1 mm. The outer       margins were smooth.      No malignant-appearing lymph nodes were visualized in the celiac region       (level 20), perigastric region and peripancreatic region.  The celiac region was visualized. Impression:               EGD Impression:                           - No gross lesions in esophagus. Z-line regular, 35                            cm from the incisors.                           - Gastritis. Biopsied.                           - A single duodenal polyp. Resected and retrieved.                           - 10 submucosal nodules were found in the duodenum.                             After EUS, each was biopsied.                           EUS Impression:                           - Intramural (subepithelial) lesions were found in                            the duodenal bulb, in the apex of the duodenal bulb                            and in the second portion of the duodenum. The                            lesions appeared to originate from within the deep                            mucosa (Layer 2) and submucosa (Layer 3). Tissue                            was obtained from this exam via biopsy, and results                            are pending. 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                           - No malignant-appearing lymph nodes were  visualized in the celiac region (level 20),                            perigastric region and peripancreatic region. Moderate Sedation:      Not Applicable - Patient had care per Anesthesia. Recommendation:           - The patient will be observed post-procedure,                            until all discharge criteria are met.                           - Discharge patient to home.                           - Patient has a contact number available for                            emergencies. The signs and symptoms of potential                            delayed complications were discussed with the                            patient. Return to normal activities tomorrow.                            Written discharge instructions were provided to the                            patient.                           - Advance diet as tolerated.                           - Follow up pathology.                           - Obtain Chromogranin A & Gastrin levels today.                           - Start Omeprazole 40 mg daily.                           - Due to the significant likelihood of  multifocal                            neuroendocrine disease, I did not pursue an                            endoscopic resection of all lesions. If the                            pathology shows that more than 3 of these lesions  are NET, then I think patient needs to have an                            opportunity to talk with a surgeon for discussion                            of resection and LN resection. I will discuss his                            case at an upcoming [place] once pathology has returned                            and consider the role of a DOTATATE Scan as well as                            potentially repeat CT-CAP.                           - If patient is not a surgical candidate, and                            accepts increased risk for disease recurrence and                            metastatic potential, we can entertain serial EMRs                            of the area.                           - The findings and recommendations were discussed                            with the patient.                           - The findings and recommendations were discussed                            with the patient's family. Procedure Code(s):        --- Professional ---                           [phone], Esophagogastroduodenoscopy, flexible,                            transoral; with removal of tumor(s), polyp(s), or                            other lesion(s) by snare technique                           [id number], Esophagogastroduodenoscopy, flexible,  transoral; with endoscopic ultrasound examination                            limited to the esophagus, stomach or duodenum, and                            adjacent structures Diagnosis Code(s):        --- Professional ---                           K29.70, Gastritis, unspecified, without bleeding                           K31.7, Polyp of stomach and duodenum                            K31.89, Other diseases of stomach and duodenum                           I89.9, Noninfective disorder of lymphatic vessels                            and lymph nodes, unspecified                           K92.9, Disease of digestive system, unspecified CPT copyright 2019 American Medical Association. All rights reserved. The codes documented in this report are preliminary and upon coder review may  be revised to meet current compliance requirements. [name], MD 06/20/2019 10:48:38 AM Number of Addenda: 0

## 2019-06-20 NOTE — Anesthesia Procedure Notes (Signed)
Procedure Name: Intubation Date/Time: 06/20/2019 8:31 AM Performed by: Eben Burow, CRNA Pre-anesthesia Checklist: Patient identified, Emergency Drugs available, Suction available, Patient being monitored and Timeout performed Patient Re-evaluated:Patient Re-evaluated prior to induction Oxygen Delivery Method: Circle system utilized Preoxygenation: Pre-oxygenation with 100% oxygen Induction Type: IV induction Ventilation: Mask ventilation without difficulty Laryngoscope Size: Mac and 4 Grade View: Grade I Tube type: Oral Tube size: 7.5 mm Number of attempts: 1 Airway Equipment and Method: Stylet Placement Confirmation: ETT inserted through vocal cords under direct vision,  positive ETCO2 and breath sounds checked- equal and bilateral Secured at: 21 cm Tube secured with: Tape Dental Injury: Teeth and Oropharynx as per pre-operative assessment

## 2019-06-20 NOTE — Transfer of Care (Signed)
Immediate Anesthesia Transfer of Care Note  Patient: Dale Jacobs  Procedure(s) Performed: ESOPHAGOGASTRODUODENOSCOPY (EGD) WITH PROPOFOL (N/A ) UPPER ENDOSCOPIC ULTRASOUND (EUS) RADIAL (N/A ) POLYPECTOMY BIOPSY  Patient Location: PACU and Endoscopy Unit  Anesthesia Type:General  Level of Consciousness: awake, drowsy and patient cooperative  Airway & Oxygen Therapy: Patient Spontanous Breathing and Patient connected to face mask oxygen  Post-op Assessment: Report given to RN and Post -op Vital signs reviewed and stable  Post vital signs: Reviewed and stable  Last Vitals:  Vitals Value Taken Time  BP 141/69 06/20/19 1003  Temp    Pulse 73 06/20/19 1005  Resp 21 06/20/19 1005  SpO2 99 % 06/20/19 1005  Vitals shown include unvalidated device data.  Last Pain:  Vitals:   06/20/19 0803  TempSrc: Oral         Complications: No apparent anesthesia complications

## 2019-06-20 NOTE — Discharge Instructions (Signed)
YOU HAD AN ENDOSCOPIC PROCEDURE TODAY: Refer to the procedure report and other information in the discharge instructions given to you for any specific questions about what was found during the examination. If this information does not answer your questions, please call [place] office at [phone] to clarify.  ° °YOU SHOULD EXPECT: Some feelings of bloating in the abdomen. Passage of more gas than usual. Walking can help get rid of the air that was put into your GI tract during the procedure and reduce the bloating. If you had a lower endoscopy (such as a colonoscopy or flexible sigmoidoscopy) you may notice spotting of blood in your stool or on the toilet paper. Some abdominal soreness may be present for a day or two, also. ° °DIET: Your first meal following the procedure should be a light meal and then it is ok to progress to your normal diet. A half-sandwich or bowl of soup is an example of a good first meal. Heavy or fried foods are harder to digest and may make you feel nauseous or bloated. Drink plenty of fluids but you should avoid alcoholic beverages for 24 hours. If you had a esophageal dilation, please see attached instructions for diet.   ° °ACTIVITY: Your care partner should take you home directly after the procedure. You should plan to take it easy, moving slowly for the rest of the day. You can resume normal activity the day after the procedure however YOU SHOULD NOT DRIVE, use power tools, machinery or perform tasks that involve climbing or major physical exertion for 24 hours (because of the sedation medicines used during the test).  ° °SYMPTOMS TO REPORT IMMEDIATELY: °A gastroenterologist can be reached at any hour. Please call [phone]  for any of the following symptoms:  °Following lower endoscopy (colonoscopy, flexible sigmoidoscopy) °Excessive amounts of blood in the stool  °Significant tenderness, worsening of abdominal pains  °Swelling of the abdomen that is new, acute  °Fever of 100° or  higher  °Following upper endoscopy (EGD, EUS, ERCP, esophageal dilation) °Vomiting of blood or coffee ground material  °New, significant abdominal pain  °New, significant chest pain or pain under the shoulder blades  °Painful or persistently difficult swallowing  °New shortness of breath  °Black, tarry-looking or red, bloody stools ° °FOLLOW UP:  °If any biopsies were taken you will be contacted by phone or by letter within the next 1-3 weeks. Call [phone]  if you have not heard about the biopsies in 3 weeks.  °Please also call with any specific questions about appointments or follow up tests. ° °

## 2019-06-20 NOTE — H&P (Addendum)
GASTROENTEROLOGY PROCEDURE H&P NOTE   Primary Care Physician: Marguerita Merles, MD  HPI: Dale Jacobs is a 74 y.o. 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  Past Medical History:  Diagnosis Date  . Diabetes mellitus without complication ([place])    Type II  . GERD (gastroesophageal reflux disease)   . Hyperlipemia   . Hypertension    Past Surgical History:  Procedure Laterality Date  . COLONOSCOPY WITH PROPOFOL N/A 10/03/2018   Procedure: COLONOSCOPY WITH PROPOFOL;  Surgeon: Lucilla Lame, MD;  Location: [facility] ENDOSCOPY;  Service: Endoscopy;  Laterality: N/A;  . ESOPHAGOGASTRODUODENOSCOPY (EGD) WITH PROPOFOL N/A 10/03/2018   Procedure: ESOPHAGOGASTRODUODENOSCOPY (EGD) WITH PROPOFOL;  Surgeon: Lucilla Lame, MD;  Location: [facility] ENDOSCOPY;  Service: Endoscopy;  Laterality: N/A;  . GIVENS CAPSULE STUDY N/A 11/06/2018   Procedure: GIVENS CAPSULE STUDY;  Surgeon: Lucilla Lame, MD;  Location: [facility] ENDOSCOPY;  Service: Endoscopy;  Laterality: N/A;   No current facility-administered medications for this encounter.   Allergies  Allergen Reactions  . Tradjenta [Linagliptin]     Unknown reaction    Family History  Problem Relation Age of Onset  . Heart disease Father   . Colon cancer Neg Hx   . Liver cancer Neg Hx   . Pancreatic cancer Neg Hx   . Prostate cancer Neg Hx   . Rectal cancer Neg Hx   . Stomach cancer Neg Hx   . Inflammatory bowel disease Neg Hx   . Esophageal cancer Neg Hx    Social History   Socioeconomic History  . Marital status: Single    Spouse name: Not on file  . Number of children: Not on file  . Years of education: Not on file  . Highest education level: Not on file  Occupational History  . Not on file  Tobacco Use  . Smoking status: Current Some Day Smoker    Types: Cigars  . Smokeless tobacco: Never Used  .  Tobacco comment: not often, "maybe 1  time a month"  Substance and Sexual Activity  . Alcohol use: Never  . Drug use: Not Currently  . Sexual activity: Not on file  Other Topics Concern  . Not on file  Social History Narrative  . Not on file   Social Determinants of Health   Financial Resource Strain:   . Difficulty of Paying Living Expenses:   Food Insecurity:   . Worried About Charity fundraiser in the Last Year:   . Arboriculturist in the Last Year:   Transportation Needs:   . Film/video editor (Medical):   Marland Kitchen Lack of Transportation (Non-Medical):   Physical Activity:   . Days of Exercise per Week:   . Minutes of Exercise per Session:   Stress:   . Feeling of Stress :   Social Connections:   . Frequency of Communication with Friends and Family:   . Frequency of Social Gatherings with Friends and Family:   . Attends Religious Services:   . Active Member of Clubs or Organizations:   . Attends Archivist Meetings:   Marland Kitchen Marital Status:   Intimate Partner Violence:   . Fear of Current or Ex-Partner:   . Emotionally Abused:   Marland Kitchen Physically Abused:   . Sexually Abused:     Physical Exam: Vital signs in last 24 hours:  GEN: NAD EYE: Sclerae anicteric ENT: MMM CV: Non-tachycardic GI: Soft, NT/ND NEURO:  Alert & Oriented x 3  Lab Results: No results for input(s): WBC, HGB, HCT, PLT in the last 72 hours. BMET No results for input(s): NA, K, CL, CO2, GLUCOSE, BUN, CREATININE, CALCIUM in the last 72 hours. LFT No results for input(s): PROT, ALBUMIN, AST, ALT, ALKPHOS, BILITOT, BILIDIR, IBILI in the last 72 hours. PT/INR No results for input(s): LABPROT, INR in the last 72 hours.   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  The risks of EUS including bleeding, infection,  aspiration pneumonia and intestinal perforation were discussed as was the possibility it may not give a definitive diagnosis.  If a biopsy of the pancreas is done as part of the EUS, there is an additional risk of pancreatitis at the rate of about 1%.  It was explained that procedure related pancreatitis is typically mild, although can be severe and even life threatening, which is why we do not perform random pancreatic biopsies and only biopsy a lesion we feel is concerning enough to warrant the risk.  The risks and benefits of endoscopic evaluation were discussed with the patient; these include but are not limited to the risk of perforation, infection, bleeding, missed lesions, lack of diagnosis, severe illness requiring hospitalization, as well as anesthesia and sedation related illnesses.  The patient is agreeable to proceed.    [name], MD [facility] # [phone]

## 2019-06-20 NOTE — Anesthesia Postprocedure Evaluation (Signed)
Anesthesia Post Note  Patient: Dale Jacobs  Procedure(s) Performed: ESOPHAGOGASTRODUODENOSCOPY (EGD) WITH PROPOFOL (N/A ) UPPER ENDOSCOPIC ULTRASOUND (EUS) RADIAL (N/A ) POLYPECTOMY BIOPSY     Patient location during evaluation: PACU Anesthesia Type: General Level of consciousness: awake and alert Pain management: pain level controlled Vital Signs Assessment: post-procedure vital signs reviewed and stable Respiratory status: spontaneous breathing, nonlabored ventilation, respiratory function stable and patient connected to nasal cannula oxygen Cardiovascular status: blood pressure returned to baseline and stable Postop Assessment: no apparent nausea or vomiting Anesthetic complications: no    Last Vitals:  Vitals:   06/20/19 1030 06/20/19 1040  BP: 118/60 118/68  Pulse: 75 72  Resp: (!) 31 (!) 22  Temp:  (!) 36.1 C  SpO2: 97% 97%    Last Pain:  Vitals:   06/20/19 1040  TempSrc: Oral  PainSc: 0-No pain                 Earlie Schank

## 2019-06-20 NOTE — Anesthesia Postprocedure Evaluation (Signed)
Anesthesia Post Note  Patient: Dale Jacobs  Procedure(s) Performed: ESOPHAGOGASTRODUODENOSCOPY (EGD) WITH PROPOFOL (N/A ) UPPER ENDOSCOPIC ULTRASOUND (EUS) RADIAL (N/A ) POLYPECTOMY BIOPSY     Anesthesia Type: General    Last Vitals:  Vitals:   06/20/19 1030 06/20/19 1040  BP: 118/60 118/68  Pulse: 75 72  Resp: (!) 31 (!) 22  Temp:  (!) 36.1 C  SpO2: 97% 97%    Last Pain:  Vitals:   06/20/19 1040  TempSrc: Oral  PainSc: 0-No pain                 Cassidi Modesitt

## 2019-06-21 ENCOUNTER — Other Ambulatory Visit: Payer: Self-pay

## 2019-06-21 ENCOUNTER — Encounter: Payer: Self-pay | Admitting: *Deleted

## 2019-06-21 LAB — GASTRIN: Gastrin: 29 pg/mL (ref 0–115)

## 2019-06-21 NOTE — Progress Notes (Signed)
Thanks for the update on Dale Jacobs.

## 2019-06-22 LAB — CHROMOGRANIN A: Chromogranin A (ng/mL): 44.5 ng/mL (ref 0.0–101.8)

## 2019-06-27 ENCOUNTER — Other Ambulatory Visit: Payer: Self-pay

## 2019-06-28 ENCOUNTER — Telehealth: Payer: Self-pay | Admitting: Gastroenterology

## 2019-06-28 ENCOUNTER — Encounter: Payer: Self-pay | Admitting: Gastroenterology

## 2019-06-28 NOTE — Progress Notes (Signed)
2) Schedule a DOTATATE Scan (indication malignant neuroendocrine tumor) per [place] discussion 3) Send referral to Dr. Marlowe Aschoff office

## 2019-06-28 NOTE — Telephone Encounter (Signed)
Hi Dr. Rush Landmark, pt's name Savana returned your call. Please call her when you have a chance. Thank you.

## 2019-06-28 NOTE — Progress Notes (Signed)
Able to speak with patient's sister today. She is aware of the [place] discussion. They will be available next week to try and get the DOTATATE Scan set up. They were appreciative for the callback.  FYI - Patty.

## 2019-06-28 NOTE — Progress Notes (Signed)
[facility] referral made and records faxed 

## 2019-06-28 NOTE — Progress Notes (Signed)
2) Schedule a DOTATATE Scan (indication malignant neuroendocrine tumor) per [place] discussion 3) Send referral to Dr. Marlowe Aschoff office

## 2019-06-28 NOTE — Telephone Encounter (Signed)
Spoke with patient and sister earlier. Thank you. GM

## 2019-06-28 NOTE — Progress Notes (Signed)
Patient's case was discussed at [facility] on 06/27/19. Reviewed History/Radiology/Pathology. Discussed that in setting of multifocal Duodenal NET that consideration of surgical resection + lymph node removal may be in best interests of patient if he is a candidate for surgery. Could entertain endoscopic resection but would not have long-term benefit. At [facility], the consensus was for [place] to move forward with updated imaging with a DOTATATE scan. I will place a referral to Dr. Barry Dienes to evaluate and I will send a message as well so that she may review when she is able to. I tried to call and update the patient and the patient's sisters Overton Mam and Leda Gauze but was not able to reach them today (06/28/19). If I do not get a hold of them today then we will reach out to them next week and move forward with the [place] scan getting set up. I will update the patient's primary GI, Dr. Allen Norris as well.  Patty, please move forward with the following: 1) Reach out to patient next week to update him to the above (if you do not see a separate message from me) 2) Schedule a DOTATATE Scan (indication malignant neuroendocrine tumor) per [place] discussion 3) Send referral to Dr. Marlowe Aschoff office  This is plan of action for now.  [name], MD [facility] # [id number]

## 2019-07-02 ENCOUNTER — Other Ambulatory Visit: Payer: Self-pay

## 2019-07-02 NOTE — Progress Notes (Signed)
Patty, Sorry about this. [place] PET (NETSPOT GA 59 DOTATATE) Skull Base to Mid Thigh - is the DOTATATE Scan that I recommend for this patient. Thank you. GM

## 2019-07-02 NOTE — Progress Notes (Signed)
Can you please clarify the type of scan needed.  CT abd pelvis dotatate?

## 2019-07-03 ENCOUNTER — Telehealth: Payer: Self-pay

## 2019-07-03 ENCOUNTER — Other Ambulatory Visit: Payer: Self-pay

## 2019-07-03 DIAGNOSIS — C7A8 Other malignant neuroendocrine tumors: Secondary | ICD-10-CM

## 2019-07-03 NOTE — Telephone Encounter (Signed)
I spoke with Lovena Le at  [facility] scheduling and she will call the pt with appt.

## 2019-07-03 NOTE — Telephone Encounter (Signed)
-----   Message from Darden Dates sent at 07/03/2019 12:52 PM EDT ----- Auth# [id number] good 07/03/19-08/17/19 He can be scheduled. ----- Message ----- From: Timothy Lasso, RN Sent: 07/03/2019   9:58 AM EDT To: Darden Dates  Amy this pt needs a [place] PET scan and they will not let me schedule until I have the prior auth.  Can you let me know when this is done?  Thanks

## 2019-07-03 NOTE — Progress Notes (Signed)
I spoke with Dale Jacobs at [facility] and she will call pt to set up.  Prior Josem Kaufmann is in the system

## 2019-07-03 NOTE — Progress Notes (Signed)
Called to schedule and prior auth needs to be completed prior to scheduling. I have sent Amy Mason Jim a message to let me know when complete.

## 2019-07-04 NOTE — Telephone Encounter (Signed)
Dr Rush Landmark the pt's sister Georjean Mode is calling saying that she missed a call from you.  Please call her at [phone].

## 2019-07-04 NOTE — Telephone Encounter (Signed)
Called the patient's sister and spoke with her at length this afternoon. We discussed the patient's initial endoscopy findings with Dr. Allen Norris and subsequent imaging and EUS attempt by myself recently with the finding of multifocal neuroendocrine tumors. She understands the next steps in evaluation are a dotatate scan. We are in the process of obtaining prior authorization. The patient will then be evaluated by surgery for consideration of surgical resection. If the patient and family do not want to pursue a surgical resection and/or if surgery is not felt to be in the patient's best interest, I would be happy to entertain a multisession of EMRs for the area, though understanding that this would not be the normal procedural process for multifocal neuroendocrine tumor. I will update the patient's primary gastroenterologist as well. Patient sister appreciative for the call back today.  [name], MD [facility] # [id number]

## 2019-07-05 NOTE — Telephone Encounter (Signed)
Called Taylor at [facility] to confirm appt has been made and left a message

## 2019-07-05 NOTE — Telephone Encounter (Signed)
The pt has an appt for [facility] appt confirmed and pt aware per Lovena Le at [facility].

## 2019-07-24 LAB — SURGICAL PATHOLOGY

## 2019-07-25 ENCOUNTER — Ambulatory Visit (HOSPITAL_COMMUNITY): Admission: RE | Admit: 2019-07-25 | Payer: Medicare Other | Source: Ambulatory Visit

## 2019-07-25 ENCOUNTER — Telehealth: Payer: Self-pay | Admitting: Gastroenterology

## 2019-07-25 NOTE — Telephone Encounter (Signed)
Patient's sister returning your call

## 2019-07-25 NOTE — Telephone Encounter (Signed)
Patty, I am sorry to hear this. I actually called the patient and only his sister answered.  I also called his other sister and neither of them knew why the patient did not show up for the imaging study. This is unfortunate in the setting of if our [facility] radiologist will not be able to offer them the dotatate scan in the future. I want to make sure that Dr. Allen Norris as well as Dr. Barry Dienes are aware. I have asked that the patient call us back in the office to please describe what has happened in regards to why he did not show up. Thanks. GM

## 2019-07-25 NOTE — Telephone Encounter (Signed)
Left message on machine to call back  

## 2019-07-25 NOTE — Telephone Encounter (Signed)
Dr Vernie Murders the pt was a no show for his [place] PET scan today.  Lovena Le with [facility] states that they may not reschedule due the cost.  $ 3500 is going to be a loss for [facility].  Please advise

## 2019-07-25 NOTE — Telephone Encounter (Signed)
Can you follow up with patient since I am away this PM? Thanks. GM

## 2019-07-26 NOTE — Telephone Encounter (Signed)
Let's try and get the consultation note. I just want to see who the MD was. GM

## 2019-07-26 NOTE — Telephone Encounter (Signed)
Dr Rush Landmark I called [facility] and was notified that the pt no showed the appt with that office on 4/19.  He has never been seen by anyone but is rescheduled with Dr Barry Dienes.

## 2019-07-26 NOTE — Telephone Encounter (Signed)
I spoke with the pt and he tells me that a MD in [facility] ([place] doctor after much questioning) advised him that he did not need the appointment for the PET. He is willing to reschedule however, [facility] is unwilling to reschedule until management approves due to the high cost $3500.

## 2019-07-27 NOTE — Telephone Encounter (Signed)
Patty, Thanks for letting me know. Hopefully, he follows up with Dr. Barry Dienes as had been the plan. In regards to rescheduling of the dotatate scan, not sure what we will end up doing but we will see with Dr. Barry Dienes thinks after her evaluation of the patient. FYI Dr. Allen Norris as well. GM

## 2019-08-14 ENCOUNTER — Other Ambulatory Visit: Payer: Self-pay | Admitting: General Surgery

## 2019-08-16 ENCOUNTER — Other Ambulatory Visit: Payer: Self-pay | Admitting: General Surgery

## 2019-08-16 DIAGNOSIS — C7A8 Other malignant neuroendocrine tumors: Secondary | ICD-10-CM

## 2019-08-27 IMAGING — CT CT ABDOMEN AND PELVIS WITH CONTRAST
2 of 5 series · 16 of 46 positions shown, 18 images · IV contrast (omnipaque)
Comparison: None.

CLINICAL DATA: Duodenal polyps (neuroendocrine tumors)

EXAM:
CT ABDOMEN AND PELVIS WITH CONTRAST
TECHNIQUE: Multidetector CT imaging of the abdomen and pelvis was performed
using the standard protocol following bolus administration of
intravenous contrast.
CONTRAST:  100mL OMNIPAQUE IOHEXOL 300 MG/ML  SOLN

[Series 2: abd pelvis · axial · 0.94mm/px · z∈[-1543,-1103]mm · 13 of 100 slices shown, 15 images]
[im 6/100  soft-tissue]
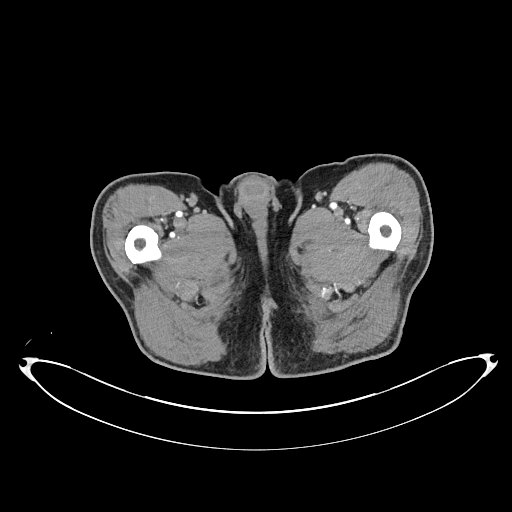
[im 6/100  bone]
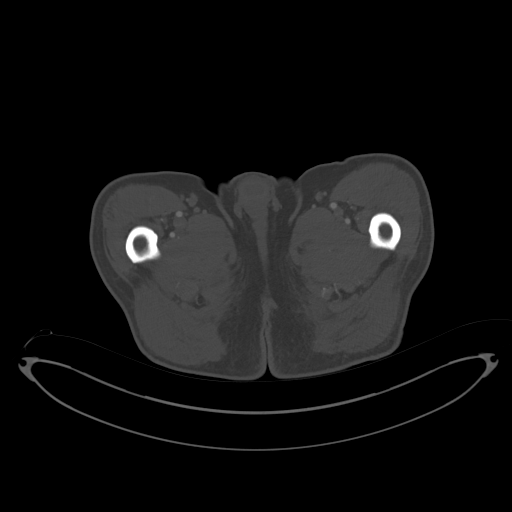
[im 16/100  soft-tissue]
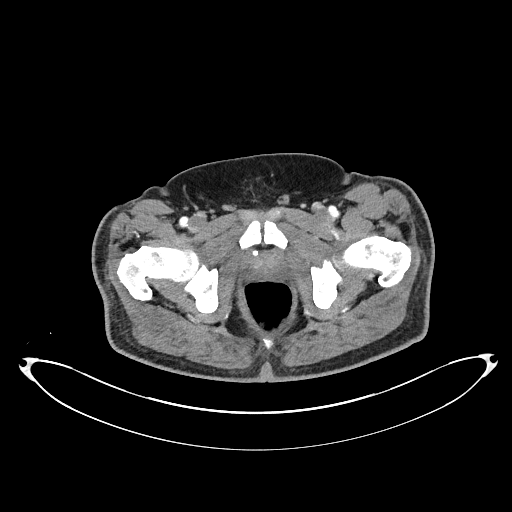
[im 21/100  soft-tissue]
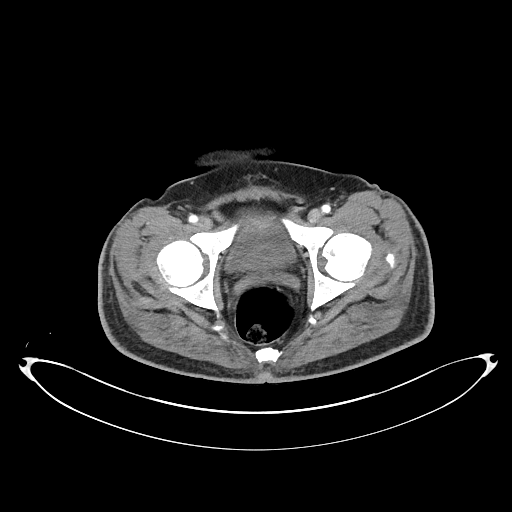
[im 27/100  soft-tissue]
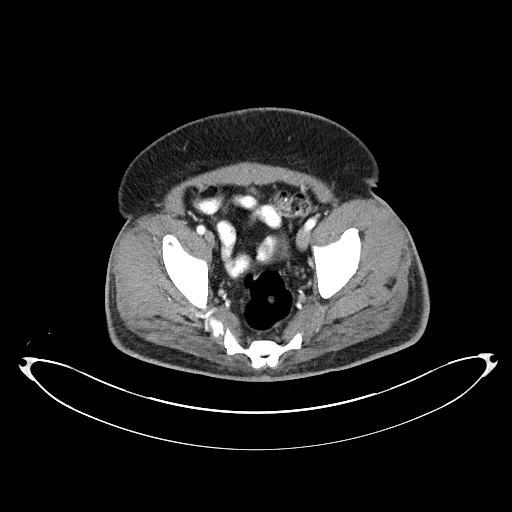
[im 37/100  soft-tissue]
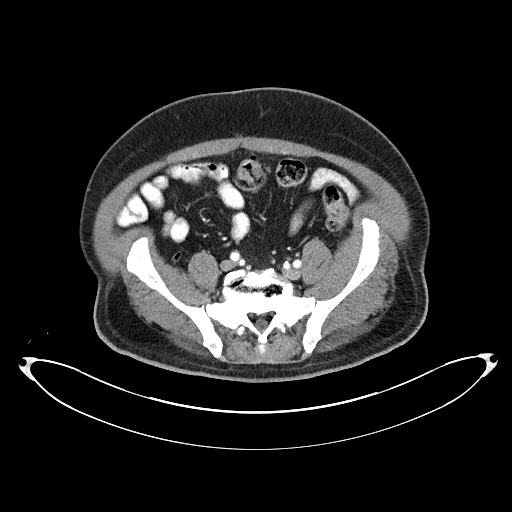
[im 42/100  soft-tissue]
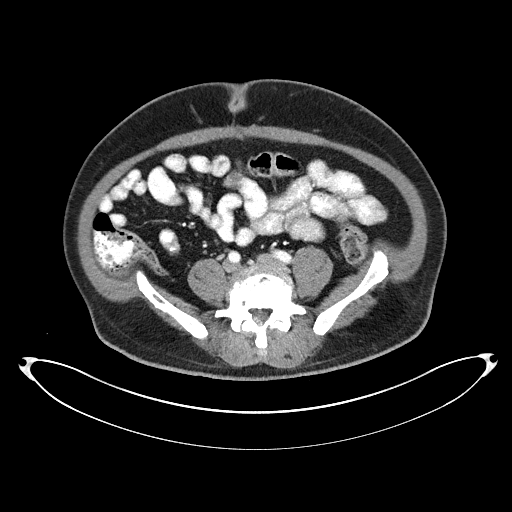
[im 53/100  soft-tissue]
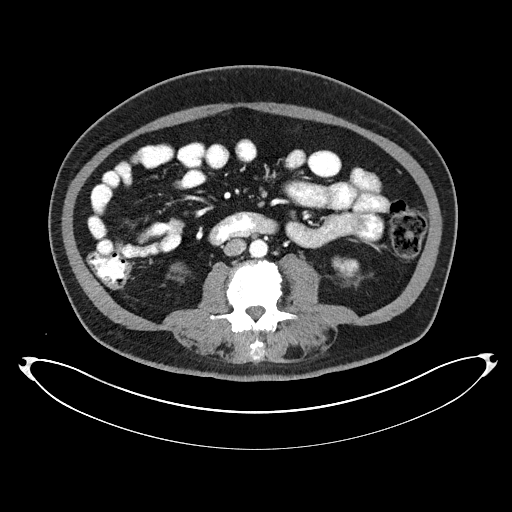
[im 58/100  soft-tissue]
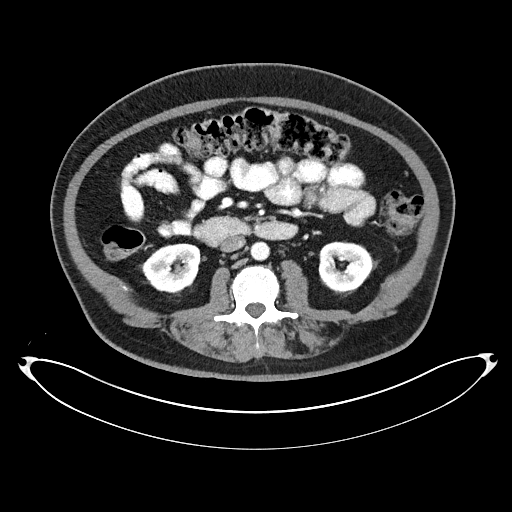
[im 63/100  soft-tissue]
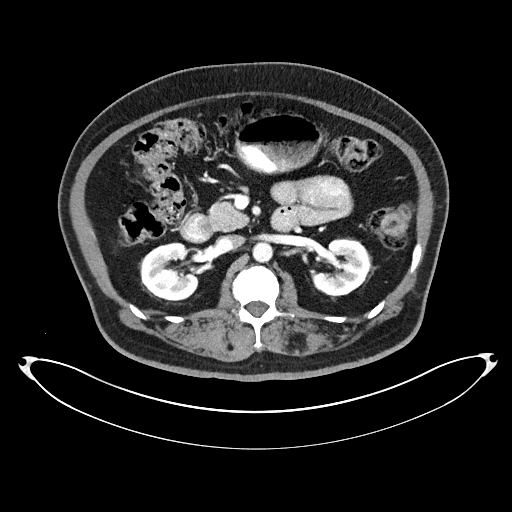
[im 63/100  bone]
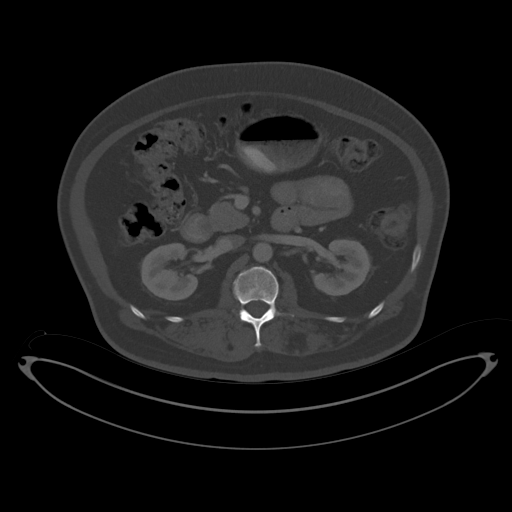
[im 73/100  soft-tissue]
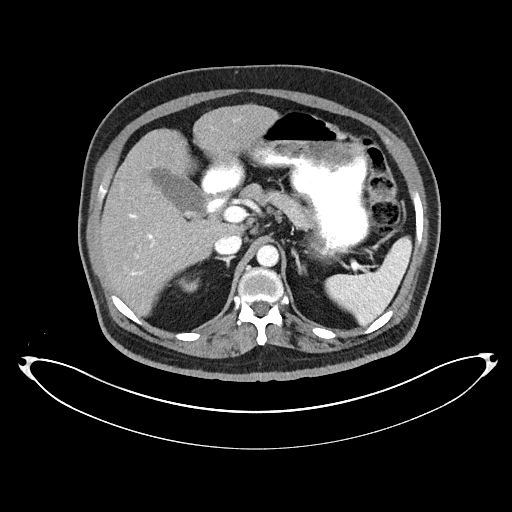
[im 79/100  soft-tissue]
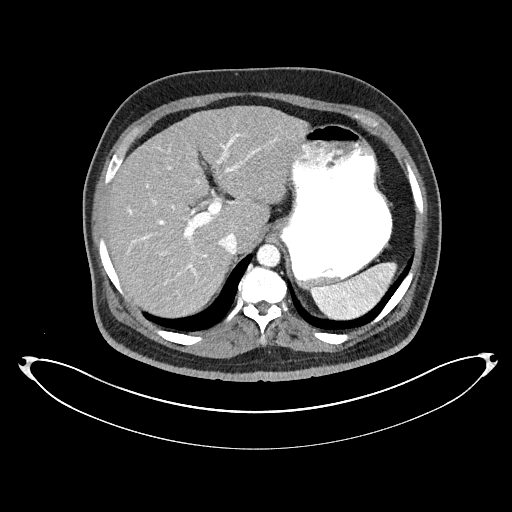
[im 84/100  soft-tissue]
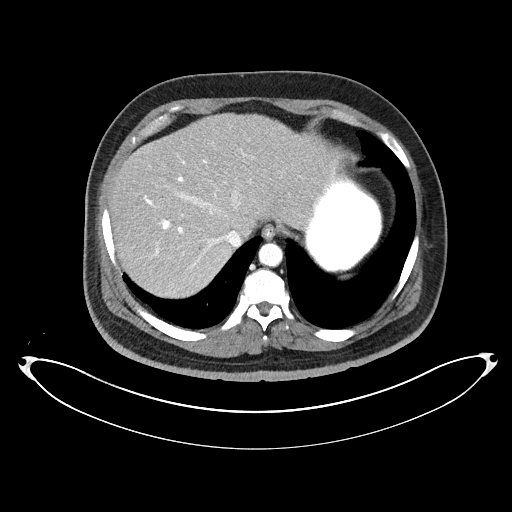
[im 94/100  soft-tissue]
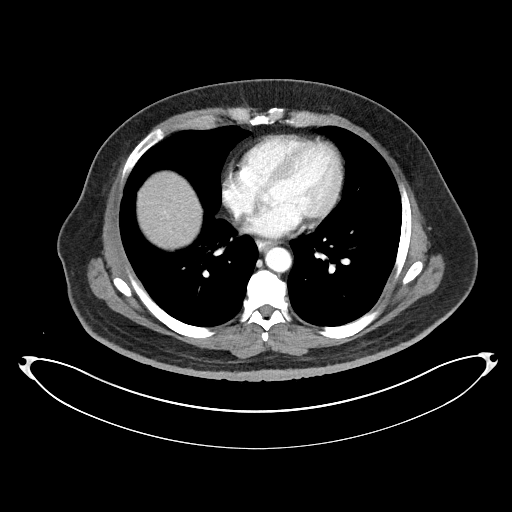

[Series 4: coronal abd pelvis · coronal · 0.82mm/px · 3 of 153 slices shown]
[im 51/153  soft-tissue]
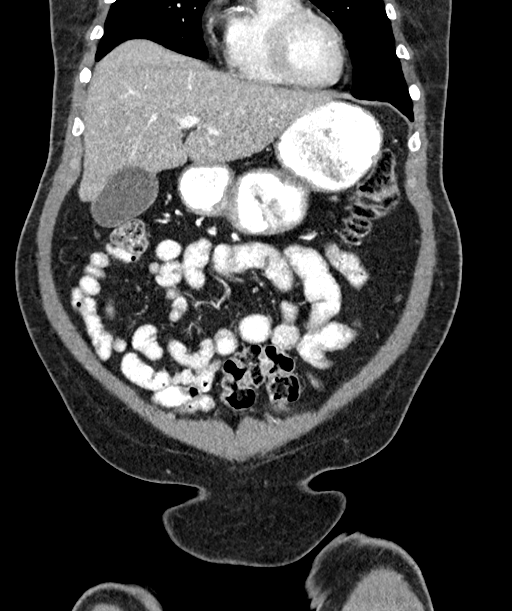
[im 68/153  soft-tissue]
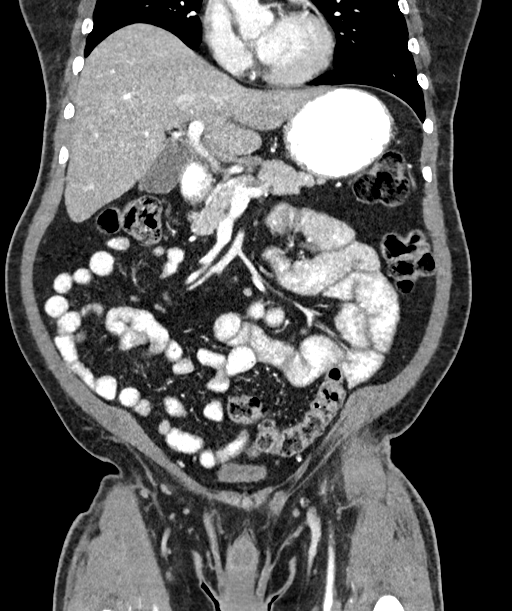
[im 85/153  soft-tissue]
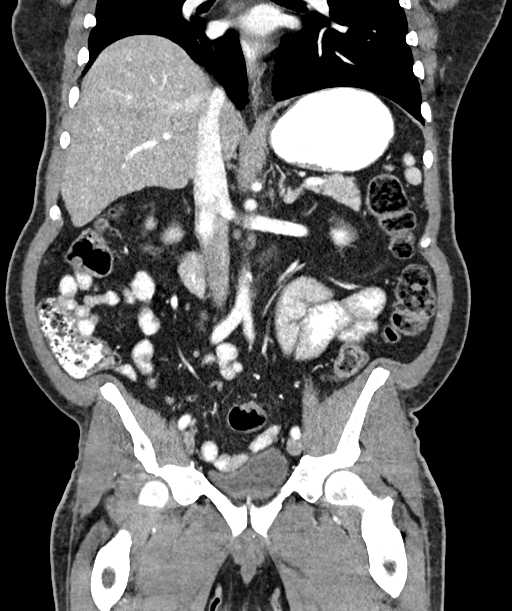

[16 of 46 positions shown; findings below may reference images not displayed]

FINDINGS: Lower chest: Mild linear scarring/atelectasis in the right lower
lobe.

Hepatobiliary: Micronodular hepatic contour. No focal hepatic lesion
is seen.

Layering gallstones, without associated inflammatory changes. No
intrahepatic or extrahepatic duct dilatation.

Pancreas: Within normal limits.

Spleen: Within normal limits.

Adrenals/Urinary Tract: Adrenal glands are within normal limits.

Kidneys are within normal limits.  No hydronephrosis.

Bladder is within normal limits.

Stomach/Bowel: Stomach is within normal limits.

Mildly thickened proximal duodenal folds without discrete mass on CT
(coronal image 80), despite the history of known duodenal
neuroendocrine tumor.

No evidence of bowel obstruction.

Normal appendix (series 2/image 61).

No colonic wall thickening or mass is seen. Radiopaque foreign body
in the rectum (series 2/image 82), likely related to capsule
endoscopy.

Vascular/Lymphatic: No evidence of abdominal aortic aneurysm.

Small para-aortic nodes measuring up to 8 mm short axis, within
normal limits. 15 mm short axis peripancreatic node (series 2/image
27). 11 mm short axis portacaval node (series 2/image 31), within
normal limits.

Reproductive: Prostate is unremarkable.

Other: No abdominopelvic ascites.

Musculoskeletal: Degenerative changes of the lumbar spine.
IMPRESSION: Mildly thickened proximal duodenal fold without discrete mass on CT.

No colonic wall thickening or mass is seen. No evidence of bowel
obstruction. Normal appendix. Capsule endoscopy camera within the
rectum.

Small upper abdominal/retroperitoneal lymph nodes, most of which are
within normal limits. A 15 mm short axis peripancreatic node is
mildly enlarged, although nonspecific/indeterminate.

Cholelithiasis, without associated inflammatory changes.

Micronodular hepatic contour, raising the possibility of early
cirrhosis.

## 2019-09-03 ENCOUNTER — Encounter: Payer: Self-pay | Admitting: Gastroenterology

## 2019-09-03 NOTE — Progress Notes (Signed)
Yes. Thank you for making me aware. I had gotten that communication in the past. Thank you for all your help.  Dale Jacobs

## 2019-09-03 NOTE — Progress Notes (Signed)
Clinic note by Dr. Barry Dienes Patient evaluated on Aug 14, 2019 The patient's clinical history and endoscopic evaluation were discussed. Based on the significant burden of duodenal neuroendocrine that was found it was felt that to really clear the duodenum the patient would need a Whipple.  This would also allow lymph node staging.  Radiology would not reorder dotatate Netspot due to previous missing of the dotatate scan that had been scheduled and loss of over $5000 that could not be reimbursed.  Plan was for updated CT chest/abdomen/pelvis to make sure no surprise lesions or enlargement of nodes.  Patient amenable to surgery but apprehensive.  He understood that there was a risk for metastatic spread if not addressed and so restaging scans were to be performed.  I appreciate Dr. Barry Dienes evaluating this patient due to his multifocal neuroendocrine disease. I will forward this to Dr. Allen Norris as well to ensure he has been made up-to-date about his patients next steps in evaluation.  I will have this note scanned into the chart.  [name], MD [facility] Office # [phone]

## 2019-10-25 ENCOUNTER — Telehealth: Payer: Self-pay | Admitting: Gastroenterology

## 2019-10-25 NOTE — Telephone Encounter (Signed)
I have not called the pt I left a message on his phone that no call has been made from this office.

## 2020-12-24 ENCOUNTER — Ambulatory Visit: Payer: Self-pay | Admitting: Gastroenterology

## 2020-12-28 ENCOUNTER — Other Ambulatory Visit: Payer: Self-pay

## 2020-12-28 ENCOUNTER — Inpatient Hospital Stay
Admission: EM | Admit: 2020-12-28 | Discharge: 2020-12-30 | DRG: 445 | Discharge status: Against Medical Advice | Payer: Medicare (Managed Care) | Attending: Surgery | Admitting: Surgery

## 2020-12-28 ENCOUNTER — Emergency Department: Payer: Medicare (Managed Care)

## 2020-12-28 DIAGNOSIS — Z20822 Contact with and (suspected) exposure to covid-19: Secondary | ICD-10-CM | POA: Diagnosis present

## 2020-12-28 DIAGNOSIS — K219 Gastro-esophageal reflux disease without esophagitis: Secondary | ICD-10-CM | POA: Diagnosis present

## 2020-12-28 DIAGNOSIS — I10 Essential (primary) hypertension: Secondary | ICD-10-CM | POA: Diagnosis present

## 2020-12-28 DIAGNOSIS — K921 Melena: Secondary | ICD-10-CM | POA: Diagnosis not present

## 2020-12-28 DIAGNOSIS — Z888 Allergy status to other drugs, medicaments and biological substances status: Secondary | ICD-10-CM | POA: Diagnosis not present

## 2020-12-28 DIAGNOSIS — K8000 Calculus of gallbladder with acute cholecystitis without obstruction: Secondary | ICD-10-CM | POA: Diagnosis not present

## 2020-12-28 DIAGNOSIS — E119 Type 2 diabetes mellitus without complications: Secondary | ICD-10-CM | POA: Diagnosis present

## 2020-12-28 DIAGNOSIS — Z8249 Family history of ischemic heart disease and other diseases of the circulatory system: Secondary | ICD-10-CM

## 2020-12-28 DIAGNOSIS — Z7982 Long term (current) use of aspirin: Secondary | ICD-10-CM | POA: Diagnosis not present

## 2020-12-28 DIAGNOSIS — D3A8 Other benign neuroendocrine tumors: Secondary | ICD-10-CM | POA: Diagnosis present

## 2020-12-28 DIAGNOSIS — Z79899 Other long term (current) drug therapy: Secondary | ICD-10-CM | POA: Diagnosis not present

## 2020-12-28 DIAGNOSIS — Z5329 Procedure and treatment not carried out because of patient's decision for other reasons: Secondary | ICD-10-CM | POA: Diagnosis not present

## 2020-12-28 DIAGNOSIS — Z8711 Personal history of peptic ulcer disease: Secondary | ICD-10-CM

## 2020-12-28 DIAGNOSIS — E876 Hypokalemia: Secondary | ICD-10-CM | POA: Diagnosis not present

## 2020-12-28 DIAGNOSIS — K810 Acute cholecystitis: Secondary | ICD-10-CM | POA: Diagnosis present

## 2020-12-28 DIAGNOSIS — Z7984 Long term (current) use of oral hypoglycemic drugs: Secondary | ICD-10-CM

## 2020-12-28 DIAGNOSIS — K08109 Complete loss of teeth, unspecified cause, unspecified class: Secondary | ICD-10-CM | POA: Diagnosis present

## 2020-12-28 DIAGNOSIS — E785 Hyperlipidemia, unspecified: Secondary | ICD-10-CM | POA: Diagnosis present

## 2020-12-28 DIAGNOSIS — Z720 Tobacco use: Secondary | ICD-10-CM

## 2020-12-28 DIAGNOSIS — R1011 Right upper quadrant pain: Secondary | ICD-10-CM

## 2020-12-28 DIAGNOSIS — K317 Polyp of stomach and duodenum: Secondary | ICD-10-CM | POA: Diagnosis present

## 2020-12-28 LAB — LIPASE, BLOOD: Lipase: 39 U/L (ref 11–51)

## 2020-12-28 LAB — URINALYSIS, COMPLETE (UACMP) WITH MICROSCOPIC
Bacteria, UA: NONE SEEN
Bilirubin Urine: NEGATIVE
Glucose, UA: NEGATIVE mg/dL
Hgb urine dipstick: NEGATIVE
Ketones, ur: 5 mg/dL — AB
Leukocytes,Ua: NEGATIVE
Nitrite: NEGATIVE
Protein, ur: 30 mg/dL — AB
Specific Gravity, Urine: 1.021 (ref 1.005–1.030)
pH: 5 (ref 5.0–8.0)

## 2020-12-28 LAB — COMPREHENSIVE METABOLIC PANEL
ALT: 35 U/L (ref 0–44)
AST: 41 U/L (ref 15–41)
Albumin: 4 g/dL (ref 3.5–5.0)
Alkaline Phosphatase: 81 U/L (ref 38–126)
Anion gap: 17 — ABNORMAL HIGH (ref 5–15)
BUN: 25 mg/dL — ABNORMAL HIGH (ref 8–23)
CO2: 20 mmol/L — ABNORMAL LOW (ref 22–32)
Calcium: 9.3 mg/dL (ref 8.9–10.3)
Chloride: 97 mmol/L — ABNORMAL LOW (ref 98–111)
Creatinine, Ser: 1.05 mg/dL (ref 0.61–1.24)
GFR, Estimated: >60 mL/min (ref >60)
Glucose, Bld: 164 mg/dL — ABNORMAL HIGH (ref 70–99)
Potassium: 3.8 mmol/L (ref 3.5–5.1)
Sodium: 134 mmol/L — ABNORMAL LOW (ref 135–145)
Total Bilirubin: 1.2 mg/dL (ref 0.3–1.2)
Total Protein: 7.5 g/dL (ref 6.5–8.1)

## 2020-12-28 LAB — CBC
HCT: 39.7 % (ref 39.0–52.0)
Hemoglobin: 13.7 g/dL (ref 13.0–17.0)
MCH: 32 pg (ref 26.0–34.0)
MCHC: 34.5 g/dL (ref 30.0–36.0)
MCV: 92.8 fL (ref 80.0–100.0)
Platelets: 267 10*3/uL (ref 150–400)
RBC: 4.28 MIL/uL (ref 4.22–5.81)
RDW: 12.7 % (ref 11.5–15.5)
WBC: 14.4 10*3/uL — ABNORMAL HIGH (ref 4.0–10.5)
nRBC: 0 % (ref 0.0–0.2)

## 2020-12-28 LAB — GLUCOSE, CAPILLARY
Glucose-Capillary: 161 mg/dL — ABNORMAL HIGH (ref 70–99)
Glucose-Capillary: 144 mg/dL — ABNORMAL HIGH (ref 70–99)

## 2020-12-28 LAB — RESP PANEL BY RT-PCR (FLU A&B, COVID) ARPGX2
Influenza A by PCR: NEGATIVE
Influenza B by PCR: NEGATIVE
SARS Coronavirus 2 by RT PCR: NEGATIVE

## 2020-12-28 MED ORDER — INSULIN ASPART 100 UNIT/ML IJ SOLN: 4.0000 [IU] | Freq: Three times a day (TID) | INTRAMUSCULAR | Status: DC

## 2020-12-28 MED ORDER — KETOROLAC TROMETHAMINE 15 MG/ML IJ SOLN
15.0000 mg | Freq: Four times a day (QID) | INTRAMUSCULAR | Status: DC | PRN
  Administered 2020-12-29: 15 mg via INTRAVENOUS
  Filled 2020-12-28 (×2): qty 1

## 2020-12-28 MED ORDER — ACETAMINOPHEN 500 MG PO TABS
1000.0000 mg | ORAL_TABLET | Freq: Four times a day (QID) | ORAL | Status: DC
  Administered 2020-12-28 – 2020-12-30 (×6): 1000 mg via ORAL
  Filled 2020-12-28 (×7): qty 2

## 2020-12-28 MED ORDER — DIPHENHYDRAMINE HCL 12.5 MG/5ML PO ELIX
12.5000 mg | ORAL_SOLUTION | Freq: Four times a day (QID) | ORAL | Status: DC | PRN
  Filled 2020-12-28: qty 5

## 2020-12-28 MED ORDER — CHLORHEXIDINE GLUCONATE CLOTH 2 % EX PADS
6.0000 | MEDICATED_PAD | Freq: Once | CUTANEOUS | Status: AC
  Administered 2020-12-29: 6 via TOPICAL

## 2020-12-28 MED ORDER — DIPHENHYDRAMINE HCL 50 MG/ML IJ SOLN: 12.5000 mg | Freq: Four times a day (QID) | INTRAMUSCULAR | Status: DC | PRN

## 2020-12-28 MED ORDER — PROCHLORPERAZINE MALEATE 10 MG PO TABS
10.0000 mg | ORAL_TABLET | Freq: Four times a day (QID) | ORAL | Status: DC | PRN
  Filled 2020-12-28: qty 1

## 2020-12-28 MED ORDER — PANTOPRAZOLE SODIUM 40 MG IV SOLR
40.0000 mg | Freq: Every day | INTRAVENOUS | Status: DC
  Administered 2020-12-28 – 2020-12-29 (×2): 40 mg via INTRAVENOUS
  Filled 2020-12-28 (×2): qty 40

## 2020-12-28 MED ORDER — SODIUM CHLORIDE 0.9 % IV SOLN: INTRAVENOUS | Status: DC

## 2020-12-28 MED ORDER — PROCHLORPERAZINE EDISYLATE 10 MG/2ML IJ SOLN: 5.0000 mg | Freq: Four times a day (QID) | INTRAMUSCULAR | Status: DC | PRN

## 2020-12-28 MED ORDER — INSULIN ASPART 100 UNIT/ML IJ SOLN: 0.0000 [IU] | Freq: Every day | INTRAMUSCULAR | Status: DC

## 2020-12-28 MED ORDER — IOHEXOL 350 MG/ML SOLN
80.0000 mL | Freq: Once | INTRAVENOUS | Status: AC | PRN
  Administered 2020-12-28: 80 mL via INTRAVENOUS

## 2020-12-28 MED ORDER — ONDANSETRON HCL 4 MG/2ML IJ SOLN
4.0000 mg | Freq: Once | INTRAMUSCULAR | Status: AC
  Administered 2020-12-28: 4 mg via INTRAVENOUS
  Filled 2020-12-28: qty 2

## 2020-12-28 MED ORDER — HEPARIN SODIUM (PORCINE) 5000 UNIT/ML IJ SOLN
5000.0000 [IU] | Freq: Three times a day (TID) | INTRAMUSCULAR | Status: DC
  Administered 2020-12-28 – 2020-12-29 (×2): 5000 [IU] via SUBCUTANEOUS
  Filled 2020-12-28 (×2): qty 1

## 2020-12-28 MED ORDER — HYDRALAZINE HCL 20 MG/ML IJ SOLN: 10.0000 mg | INTRAMUSCULAR | Status: DC | PRN

## 2020-12-28 MED ORDER — PIPERACILLIN-TAZOBACTAM 3.375 G IVPB
3.3750 g | Freq: Three times a day (TID) | INTRAVENOUS | Status: DC
Start: 2020-12-28 — End: 2020-12-30
  Administered 2020-12-28 – 2020-12-30 (×4): 3.375 g via INTRAVENOUS
  Filled 2020-12-28 (×4): qty 50

## 2020-12-28 MED ORDER — SODIUM CHLORIDE 0.9 % IV BOLUS: 1000.0000 mL | Freq: Once | INTRAVENOUS | Status: DC

## 2020-12-28 MED ORDER — PIPERACILLIN-TAZOBACTAM 3.375 G IVPB 30 MIN
3.3750 g | Freq: Once | INTRAVENOUS | Status: AC
  Administered 2020-12-28: 3.375 g via INTRAVENOUS
  Filled 2020-12-28: qty 50

## 2020-12-28 MED ORDER — FAMOTIDINE 20 MG IN NS 100 ML IVPB
20.0000 mg | Freq: Once | INTRAVENOUS | Status: AC
  Administered 2020-12-28: 20 mg via INTRAVENOUS
  Filled 2020-12-28: qty 100

## 2020-12-28 MED ORDER — SODIUM CHLORIDE 0.9 % IV BOLUS
1000.0000 mL | Freq: Once | INTRAVENOUS | Status: AC
  Administered 2020-12-28: 1000 mL via INTRAVENOUS

## 2020-12-28 MED ORDER — ONDANSETRON HCL 4 MG/2ML IJ SOLN: 4.0000 mg | Freq: Four times a day (QID) | INTRAMUSCULAR | Status: DC | PRN

## 2020-12-28 MED ORDER — OXYCODONE HCL 5 MG PO TABS: 5.0000 mg | ORAL_TABLET | ORAL | Status: DC | PRN

## 2020-12-28 MED ORDER — SODIUM CHLORIDE 0.9 % IV SOLN
INTRAVENOUS | Status: DC | PRN
  Administered 2020-12-28: 1000 mL via INTRAVENOUS

## 2020-12-28 MED ORDER — MORPHINE SULFATE (PF) 2 MG/ML IV SOLN
2.0000 mg | INTRAVENOUS | Status: DC | PRN
Start: 2020-12-28 — End: 2020-12-30
  Filled 2020-12-28: qty 1

## 2020-12-28 MED ORDER — ONDANSETRON 4 MG PO TBDP: 4.0000 mg | ORAL_TABLET | Freq: Four times a day (QID) | ORAL | Status: DC | PRN

## 2020-12-28 MED ORDER — INSULIN ASPART 100 UNIT/ML IJ SOLN
0.0000 [IU] | Freq: Three times a day (TID) | INTRAMUSCULAR | Status: DC
  Administered 2020-12-28: 4 [IU] via SUBCUTANEOUS
  Administered 2020-12-29: 3 [IU] via SUBCUTANEOUS
  Administered 2020-12-29: 4 [IU] via SUBCUTANEOUS
  Filled 2020-12-28 (×3): qty 1

## 2020-12-28 NOTE — ED Triage Notes (Signed)
Pt comes pov with generalized abd pain. Denies n/v. States some diarrhea. States he has some "polyps" and that's what is flaring up.

## 2020-12-28 NOTE — ED Provider Notes (Signed)
Emergency Medicine Provider Triage Evaluation Note  STEPHFON BOVEY , a 75 y.o. male  was evaluated in triage.  Pt complains of decreased appetite, abdominal pain, constipation and loose stools.  Abdominal pain is generalized.  No fever, chills.  Review of Systems  Positive: Decreased appetite, abdominal pain, constipation, loose stools. Negative: Nausea, vomiting, reflux, blood in his stool or urinary symptoms  Physical Exam  BP 113/74   Pulse 87   Temp 98.3 F (36.8 C) (Oral)   Resp 18   Ht 5\' 4"  (1.626 m)   Wt 77.1 kg   SpO2 94%   BMI 29.18 kg/m  Gen:   Awake, no distress   Resp:  Normal effort  Abd:  Generally tender, active bowel sounds  Medical Decision Making  Medically screening exam initiated at 11:33 AM.  Appropriate orders placed.  Shaun R Larmer was informed that the remainder of the evaluation will be completed by another provider, this initial triage assessment does not replace that evaluation, and the importance of remaining in the ED until their evaluation is complete.     Jearld Fenton, NP 12/28/20 1139    Lucrezia Starch, MD 12/28/20 1524

## 2020-12-28 NOTE — H&P (Signed)
Patient ID: Dale Jacobs, male   DOB: [birth year], 75 y.o.   MRN: [id number]  HPI Dale Jacobs is a 75 y.o. male into the emergency room with a 4-day history of abdominal pain mainly in the right upper quadrant.  The pain is intermittent severe intensity worsening with inspiration.  There is no significant alleviating or aggravating factors.  He reports that he has this before in the past.  He denies any fevers any chills no evidence of biliary obstruction.  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  That he has been on whole and there is being of loss of follow-up.  Does have a white count of 14,000 the rest of the CBC is normal.  CMP was normal other than slight elevation of the BUN to 25.  LFTs.  He did have an ultrasound and CT scan that have personally reviewed showing evidence of a distended gallbladder with pericholecystic fluid consistent with acute cholecystitis.  There is no evidence of biliary dilation.  Gallbladder wall is thick measuring at 5 mm. Warm to him is able to perform more than 4 METS of activity without any shortness of breath or chest pain.  No prior major abdominal operations.  HPI  Past Medical History:  Diagnosis Date   Diabetes mellitus without complication (HCC)    Type II   GERD (gastroesophageal reflux disease)    Hyperlipemia    Hypertension     Past Surgical History:  Procedure Laterality Date   BIOPSY  06/20/2019   Procedure: BIOPSY;  Surgeon: Irving Copas., MD;  Location: [facility];  Service: Gastroenterology;;   COLONOSCOPY WITH PROPOFOL N/A 10/03/2018   Procedure: COLONOSCOPY WITH PROPOFOL;  Surgeon: Lucilla Lame, MD;  Location: [facility] ENDOSCOPY;  Service: Endoscopy;  Laterality: N/A;   ESOPHAGOGASTRODUODENOSCOPY (EGD) WITH PROPOFOL N/A 10/03/2018   Procedure: ESOPHAGOGASTRODUODENOSCOPY (EGD) WITH PROPOFOL;  Surgeon: Lucilla Lame, MD;  Location: [facility] ENDOSCOPY;  Service: Endoscopy;   Laterality: N/A;   ESOPHAGOGASTRODUODENOSCOPY (EGD) WITH PROPOFOL N/A 06/20/2019   Procedure: ESOPHAGOGASTRODUODENOSCOPY (EGD) WITH PROPOFOL;  Surgeon: Rush Landmark Telford Nab., MD;  Location: [facility] ENDOSCOPY;  Service: Gastroenterology;  Laterality: N/A;   EUS N/A 06/20/2019   Procedure: UPPER ENDOSCOPIC ULTRASOUND (EUS) RADIAL;  Surgeon: Irving Copas., MD;  Location: [facility];  Service: Gastroenterology;  Laterality: N/A;   GIVENS CAPSULE STUDY N/A 11/06/2018   Procedure: GIVENS CAPSULE STUDY;  Surgeon: Lucilla Lame, MD;  Location: [facility] ENDOSCOPY;  Service: Endoscopy;  Laterality: N/A;   POLYPECTOMY  06/20/2019   Procedure: POLYPECTOMY;  Surgeon: Mansouraty, Telford Nab., MD;  Location: [facility] ENDOSCOPY;  Service: Gastroenterology;;    Family History  Problem Relation Age of Onset   Heart disease Father    Colon cancer Neg Hx    Liver cancer Neg Hx    Pancreatic cancer Neg Hx    Prostate cancer Neg Hx    Rectal cancer Neg Hx    Stomach cancer Neg Hx    Inflammatory bowel disease Neg Hx    Esophageal cancer Neg Hx     Social History Social History   Tobacco Use   Smoking status: Some Days    Types: Cigars   Smokeless tobacco: Never   Tobacco comments:    not often, "maybe 1  time a month"  Vaping Use   Vaping Use: Never used  Substance Use Topics   Alcohol use: Never   Drug use: Not Currently    Allergies  Allergen  Reactions   Tradjenta [Linagliptin]     Unknown reaction     Current Facility-Administered Medications  Medication Dose Route Frequency Provider Last Rate Last Admin   piperacillin-tazobactam (ZOSYN) IVPB 3.375 g  3.375 g Intravenous Once Lorna Dibble, RPH 100 mL/hr at 12/28/20 1615 3.375 g at 12/28/20 1615   Current Outpatient Medications  Medication Sig Dispense Refill   Accu-Chek FastClix Lancets MISC      amLODipine (NORVASC) 5 MG tablet Take 5 mg by mouth daily.     aspirin EC 81 MG tablet Take 81 mg by mouth daily.     atorvastatin  (LIPITOR) 40 MG tablet Take 40 mg by mouth at bedtime.      cholecalciferol (VITAMIN D3) 25 MCG (1000 UNIT) tablet Take 1,000 Units by mouth daily.     ferrous sulfate 325 (65 FE) MG tablet Take 325 mg by mouth 2 (two) times daily.      GLIPIZIDE XL 10 MG 24 hr tablet Take 10 mg by mouth 2 (two) times daily.      hydrochlorothiazide (HYDRODIURIL) 25 MG tablet Take 25 mg by mouth daily.      lisinopril (ZESTRIL) 40 MG tablet Take 40 mg by mouth daily.      metFORMIN (GLUCOPHAGE) 500 MG tablet Take 1,000 mg by mouth 2 (two) times daily.      omeprazole (PRILOSEC) 40 MG capsule Take 1 capsule (40 mg total) by mouth daily. Take before breakfast or before dinner. 30 capsule 4     Review of Systems Full ROS  was asked and was negative except for the information on the HPI  Physical Exam Blood pressure (!) 152/82, pulse 100, temperature 98.3 F (36.8 C), temperature source Oral, resp. rate 18, height 5\' 4"  (1.626 m), weight 77.1 kg, SpO2 93 %. CONSTITUTIONAL: NAD EYES: Pupils are equal, round,  Sclera are non-icteric. EARS, NOSE, MOUTH AND THROAT: He is wearing a mask. Hearing is intact to voice. LYMPH NODES:  Lymph nodes in the neck are normal. RESPIRATORY:  Lungs are clear. There is normal respiratory effort, with equal breath sounds bilaterally, and without pathologic use of accessory muscles. CARDIOVASCULAR: Heart is regular without murmurs, gallops, or rubs. GI: The abdomen is soft, TTP RUQ w + Murphy sign. There are no palpable masses. There is no hepatosplenomegaly. There are normal bowel sounds GU: Rectal deferred.   MUSCULOSKELETAL: Normal muscle strength and tone. No cyanosis or edema.   SKIN: Turgor is good and there are no pathologic skin lesions or ulcers. NEUROLOGIC: Motor and sensation is grossly normal. Cranial nerves are grossly intact. PSYCH:  Oriented to person, place and time. Affect is normal.  Data Reviewed  I have personally reviewed the patient's imaging, laboratory  findings and medical records.    Assessment/Plan  75 year old male with classic signs and symptoms consistent with acute cholecystitis.  He will be admitted to the hospital started on IV fluids antibiotics and will be posted for tomorrow for a robotic cholecystectomy.  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  However I do think that cholecystitis take precedence over prior neuroendocrine tumor.  Please note that it is not evident on CT findings. He does have changes consistent with cirrhosis on CT scan but no evidence of cirrhosis on LFTs or physical exam I discussed the procedure in detail.  The patient was given Neurosurgeon.  We  discussed the risks and benefits of a laparoscopic cholecystectomy and possible cholangiogram including, but not limited to bleeding, infection, injury to surrounding structures such as the intestine or liver, bile leak, retained gallstones, need to convert to an open procedure, prolonged diarrhea, blood clots such as  DVT, common bile duct injury, anesthesia risks, and possible need for additional procedures.  The likelihood of improvement in symptoms and return to the patient's normal status is good. We discussed the typical post-operative recovery course.   Caroleen Hamman, MD FACS General Surgeon 12/28/2020, 4:26 PM

## 2020-12-28 NOTE — ED Provider Notes (Signed)
[facility]  ____________________________________________   Event Date/Time   First MD Initiated Contact with Patient 12/28/20 1254     (approximate)  I have reviewed the triage vital signs and the nursing notes.   HISTORY  Chief Complaint Abdominal Pain    HPI Dale Jacobs is a 75 y.o. male past medical history type 2 diabetes, GERD, hypertension, hyperlipidemia, duodenal neuroendocrine tumor who presents with abdominal pain.  Symptoms started about 3 days ago.  Pain is generalized and constant.  He has had nausea but no vomiting.  Says he has not been able to eat because of the pain.  No diarrhea constipation or blood in his stool.  Does feel like he may have had a fever.  Per chart review patient was following with GI and there had been potential plan for resection of his duodenal neuroendocrine tumor.  Looks as if he has lost to follow-up.         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   Polyp of duodenum    Polyp of ascending colon    Anemia 09/12/2018   Back pain 09/12/2018   Onychomycosis 09/12/2018   Hyperlipidemia 09/12/2018   Trochanteric bursitis 09/12/2018   Hyperkalemia 09/12/2018   Essential hypertension 09/12/2018   Obesity 09/12/2018   Diabetes mellitus, type 2 ([place]) 09/12/2018    Past Surgical History:  Procedure Laterality Date   BIOPSY  06/20/2019   Procedure: BIOPSY;  Surgeon: Irving Copas., MD;  Location: [facility];  Service: Gastroenterology;;   COLONOSCOPY WITH PROPOFOL N/A 10/03/2018   Procedure: COLONOSCOPY WITH PROPOFOL;  Surgeon: Lucilla Lame, MD;  Location: [facility] ENDOSCOPY;  Service: Endoscopy;  Laterality: N/A;   ESOPHAGOGASTRODUODENOSCOPY (EGD) WITH  PROPOFOL N/A 10/03/2018   Procedure: ESOPHAGOGASTRODUODENOSCOPY (EGD) WITH PROPOFOL;  Surgeon: Lucilla Lame, MD;  Location: [facility] ENDOSCOPY;  Service: Endoscopy;  Laterality: N/A;   ESOPHAGOGASTRODUODENOSCOPY (EGD) WITH PROPOFOL N/A 06/20/2019   Procedure: ESOPHAGOGASTRODUODENOSCOPY (EGD) WITH PROPOFOL;  Surgeon: Rush Landmark Telford Nab., MD;  Location: [facility];  Service: Gastroenterology;  Laterality: N/A;   EUS N/A 06/20/2019   Procedure: UPPER ENDOSCOPIC ULTRASOUND (EUS) RADIAL;  Surgeon: Irving Copas., MD;  Location: [facility];  Service: Gastroenterology;  Laterality: N/A;   GIVENS CAPSULE STUDY N/A 11/06/2018   Procedure: GIVENS CAPSULE STUDY;  Surgeon: Lucilla Lame, MD;  Location: [facility] ENDOSCOPY;  Service: Endoscopy;  Laterality: N/A;   POLYPECTOMY  06/20/2019   Procedure: POLYPECTOMY;  Surgeon: Rush Landmark Telford Nab., MD;  Location: [facility];  Service: Gastroenterology;;    Prior to Admission medications   Medication Sig Start Date End Date Taking? Authorizing Provider  Accu-Chek FastClix Lancets MISC  04/07/18   [provider]  aspirin EC 81 MG tablet Take 81 mg by mouth daily.    [provider]  atorvastatin (LIPITOR) 40 MG tablet Take 40 mg by mouth at bedtime.     [provider]  cholecalciferol (VITAMIN D3) 25 MCG (1000 UNIT) tablet Take 1,000 Units by mouth daily.    [provider]  ferrous sulfate 325 (65 FE) MG tablet Take 325 mg by mouth 2 (two) times daily.     [provider]  GLIPIZIDE XL 10 MG 24 hr tablet Take  10 mg by mouth 2 (two) times daily.  09/07/18   [provider]  hydrochlorothiazide (HYDRODIURIL) 25 MG tablet Take 25 mg by mouth daily.  09/07/18   [provider]  lisinopril (ZESTRIL) 40 MG tablet Take 40 mg by mouth daily.  08/01/18   [provider]  metFORMIN (GLUCOPHAGE) 500 MG tablet Take 1,000 mg by mouth 2 (two) times daily.  09/07/18   [provider]   omeprazole (PRILOSEC) 40 MG capsule Take 1 capsule (40 mg total) by mouth daily. Take before breakfast or before dinner. 06/20/19 06/19/20  Mansouraty, Telford Nab., MD    Allergies Tradjenta [linagliptin]  Family History  Problem Relation Age of Onset   Heart disease Father    Colon cancer Neg Hx    Liver cancer Neg Hx    Pancreatic cancer Neg Hx    Prostate cancer Neg Hx    Rectal cancer Neg Hx    Stomach cancer Neg Hx    Inflammatory bowel disease Neg Hx    Esophageal cancer Neg Hx     Social History Social History   Tobacco Use   Smoking status: Some Days    Types: Cigars   Smokeless tobacco: Never   Tobacco comments:    not often, "maybe 1  time a month"  Vaping Use   Vaping Use: Never used  Substance Use Topics   Alcohol use: Never   Drug use: Not Currently    Review of Systems   Review of Systems  Constitutional:  Positive for appetite change and fever.  Respiratory:  Negative for shortness of breath.   Cardiovascular:  Negative for chest pain.  Gastrointestinal:  Positive for abdominal pain and nausea. Negative for blood in stool, constipation, diarrhea and vomiting.  Genitourinary:  Negative for dysuria.  All other systems reviewed and are negative.  Physical Exam Updated Vital Signs BP (!) 154/74 (BP Location: Right Arm)   Pulse 83   Temp 98.3 F (36.8 C) (Oral)   Resp 17   Ht 5\' 4"  (1.626 m)   Wt 77.1 kg   SpO2 96%   BMI 29.18 kg/m   Physical Exam Vitals and nursing note reviewed.  Constitutional:      General: He is not in acute distress.    Appearance: Normal appearance.  HENT:     Head: Normocephalic and atraumatic.  Eyes:     General: No scleral icterus.    Conjunctiva/sclera: Conjunctivae normal.  Pulmonary:     Effort: Pulmonary effort is normal. No respiratory distress.     Breath sounds: Normal breath sounds. No wheezing.  Abdominal:     General: Abdomen is flat.     Palpations: Abdomen is soft.     Tenderness: There is  abdominal tenderness.     Comments: Abdomen is soft but tender in the right upper quadrant epigastric region with voluntary guarding  Musculoskeletal:        General: No deformity or signs of injury.     Cervical back: Normal range of motion.  Skin:    Coloration: Skin is not jaundiced or pale.  Neurological:     General: No focal deficit present.     Mental Status: He is alert and oriented to person, place, and time. Mental status is at baseline.     Comments: Patient has some occultly understanding, unclear if hard of hearing or intellectual disability  Psychiatric:        Mood and Affect: Mood normal.  Behavior: Behavior normal.     LABS (all labs ordered are listed, but only abnormal results are displayed)  Labs Reviewed  COMPREHENSIVE METABOLIC PANEL - Abnormal; Notable for the following components:      Result Value   Sodium 134 (*)    Chloride 97 (*)    CO2 20 (*)    Glucose, Bld 164 (*)    BUN 25 (*)    Anion gap 17 (*)    All other components within normal limits  CBC - Abnormal; Notable for the following components:   WBC 14.4 (*)    All other components within normal limits  URINALYSIS, COMPLETE (UACMP) WITH MICROSCOPIC - Abnormal; Notable for the following components:   Color, Urine YELLOW (*)    APPearance CLEAR (*)    Ketones, ur 5 (*)    Protein, ur 30 (*)    All other components within normal limits  CULTURE, BLOOD (ROUTINE X 2)  CULTURE, BLOOD (ROUTINE X 2)  RESP PANEL BY RT-PCR (FLU A&B, COVID) ARPGX2  LIPASE, BLOOD   ____________________________________________  EKG  N/a ____________________________________________  RADIOLOGY Almeta Monas, personally viewed and evaluated these images (plain radiographs) as part of my medical decision making, as well as reviewing the written report by the radiologist.  ED MD interpretation: I reviewed the CT scan of the abdomen which shows rectal wall thickening consistent with colitis as well as  gallstones but limited evaluation of wall thickening due to motion artifact    ____________________________________________   PROCEDURES  Procedure(s) performed (including Critical Care):  Procedures   ____________________________________________   INITIAL IMPRESSION / ASSESSMENT AND PLAN / ED COURSE     75 year old male with history of duodenal neuroendocrine tumor presents with abdominal pain and 3 days.  Vital signs within normal limits.  Labs obtained from triage show an leukocytosis of 14, normal lipase and LFTs.  His bicarb is on the low side and he does have an anion gap of 17.  Suspect that this is in the setting of ketosis.  Will give fluids Pepcid and order CT abdomen pelvis with contrast.  CAT scan shows rectal wall thickening consistent with a mild colitis.  He does have gallstones and the scan is read as indeterminate for any wall thickening given motion artifact.  Patient was tender in the right upper quadrant so I will obtain a right upper quadrant ultrasound.  Right upper ultrasound shows stones, wall thickening and pericholecystic fluid.  Recent negative.  Will discuss with general surgery.  At time of signout patient is pending surgical consult.     ____________________________________________   FINAL CLINICAL IMPRESSION(S) / ED DIAGNOSES  Final diagnoses:  RUQ pain     ED Discharge Orders     None        Note:  This document was prepared using Dragon voice recognition software and may include unintentional dictation errors.    Rada Hay, MD 12/28/20 [phone]

## 2020-12-28 NOTE — Consult Note (Signed)
PHARMACY -  BRIEF ANTIBIOTIC NOTE   Pharmacy has received consult(s) for Zosyn from an ED provider.  The patient's profile has been reviewed for ht/wt/allergies/indication/available labs.    One time order(s) placed for: Zosyn 3.375g (30-min inf) x1 entered  Further antibiotics/pharmacy consults should be ordered by admitting physician if indicated.                       Thank you, Lorna Dibble 12/28/2020  3:52 PM

## 2020-12-29 ENCOUNTER — Encounter
Admission: EM | Discharge status: Against Medical Advice | Payer: Self-pay | Source: Home / Self Care | Attending: Surgery

## 2020-12-29 ENCOUNTER — Inpatient Hospital Stay: Payer: Medicare (Managed Care) | Admitting: Anesthesiology

## 2020-12-29 ENCOUNTER — Encounter: Payer: Self-pay | Admitting: Surgery

## 2020-12-29 DIAGNOSIS — K921 Melena: Secondary | ICD-10-CM | POA: Diagnosis not present

## 2020-12-29 HISTORY — PX: ESOPHAGOGASTRODUODENOSCOPY (EGD) WITH PROPOFOL: SHX5813

## 2020-12-29 LAB — CBC
HCT: 34.6 % — ABNORMAL LOW (ref 39.0–52.0)
Hemoglobin: 12.1 g/dL — ABNORMAL LOW (ref 13.0–17.0)
MCH: 31.5 pg (ref 26.0–34.0)
MCHC: 35 g/dL (ref 30.0–36.0)
MCV: 90.1 fL (ref 80.0–100.0)
Platelets: 250 10*3/uL (ref 150–400)
RBC: 3.84 MIL/uL — ABNORMAL LOW (ref 4.22–5.81)
RDW: 12.8 % (ref 11.5–15.5)
WBC: 19.2 10*3/uL — ABNORMAL HIGH (ref 4.0–10.5)
nRBC: 0 % (ref 0.0–0.2)

## 2020-12-29 LAB — BASIC METABOLIC PANEL
Anion gap: 10 (ref 5–15)
BUN: 20 mg/dL (ref 8–23)
CO2: 22 mmol/L (ref 22–32)
Calcium: 8.4 mg/dL — ABNORMAL LOW (ref 8.9–10.3)
Chloride: 100 mmol/L (ref 98–111)
Creatinine, Ser: 0.89 mg/dL (ref 0.61–1.24)
GFR, Estimated: >60 mL/min (ref >60)
Glucose, Bld: 142 mg/dL — ABNORMAL HIGH (ref 70–99)
Potassium: 3.3 mmol/L — ABNORMAL LOW (ref 3.5–5.1)
Sodium: 132 mmol/L — ABNORMAL LOW (ref 135–145)

## 2020-12-29 LAB — GLUCOSE, CAPILLARY
Glucose-Capillary: 148 mg/dL — ABNORMAL HIGH (ref 70–99)
Glucose-Capillary: 174 mg/dL — ABNORMAL HIGH (ref 70–99)
Glucose-Capillary: 147 mg/dL — ABNORMAL HIGH (ref 70–99)
Glucose-Capillary: 154 mg/dL — ABNORMAL HIGH (ref 70–99)
Glucose-Capillary: 145 mg/dL — ABNORMAL HIGH (ref 70–99)
Glucose-Capillary: 145 mg/dL — ABNORMAL HIGH (ref 70–99)
Glucose-Capillary: 117 mg/dL — ABNORMAL HIGH (ref 70–99)

## 2020-12-29 LAB — HEMOGLOBIN A1C
Hgb A1c MFr Bld: 7.1 % — ABNORMAL HIGH (ref 4.8–5.6)
Mean Plasma Glucose: 157 mg/dL

## 2020-12-29 LAB — OCCULT BLOOD X 1 CARD TO LAB, STOOL: Fecal Occult Bld: POSITIVE — AB

## 2020-12-29 SURGERY — ESOPHAGOGASTRODUODENOSCOPY (EGD) WITH PROPOFOL
Anesthesia: General

## 2020-12-29 SURGERY — CHOLECYSTECTOMY, ROBOT-ASSISTED, LAPAROSCOPIC
Anesthesia: General

## 2020-12-29 MED ORDER — LIDOCAINE HCL (PF) 2 % IJ SOLN
INTRAMUSCULAR | Status: AC
  Filled 2020-12-29: qty 5

## 2020-12-29 MED ORDER — SODIUM CHLORIDE 0.9 % IV SOLN: INTRAVENOUS | Status: DC

## 2020-12-29 MED ORDER — ONDANSETRON HCL 4 MG/2ML IJ SOLN
INTRAMUSCULAR | Status: AC
  Filled 2020-12-29: qty 2

## 2020-12-29 MED ORDER — PROPOFOL 10 MG/ML IV BOLUS
INTRAVENOUS | Status: AC
  Filled 2020-12-29: qty 20

## 2020-12-29 MED ORDER — GLYCOPYRROLATE 0.2 MG/ML IJ SOLN
INTRAMUSCULAR | Status: AC
  Filled 2020-12-29: qty 1

## 2020-12-29 MED ORDER — SODIUM CHLORIDE 0.9 % IV SOLN: Freq: Once | INTRAVENOUS | Status: DC

## 2020-12-29 MED ORDER — LIDOCAINE HCL (CARDIAC) PF 100 MG/5ML IV SOSY
PREFILLED_SYRINGE | INTRAVENOUS | Status: DC | PRN
  Administered 2020-12-29: 100 mg via INTRAVENOUS

## 2020-12-29 MED ORDER — ROCURONIUM BROMIDE 10 MG/ML (PF) SYRINGE
PREFILLED_SYRINGE | INTRAVENOUS | Status: AC
  Filled 2020-12-29: qty 10

## 2020-12-29 MED ORDER — PEG 3350-KCL-NA BICARB-NACL 420 G PO SOLR
4000.0000 mL | Freq: Once | ORAL | Status: AC
  Administered 2020-12-29: 4000 mL via ORAL
  Filled 2020-12-29: qty 4000

## 2020-12-29 MED ORDER — DEXAMETHASONE SODIUM PHOSPHATE 10 MG/ML IJ SOLN
INTRAMUSCULAR | Status: AC
  Filled 2020-12-29: qty 1

## 2020-12-29 MED ORDER — PROPOFOL 10 MG/ML IV BOLUS
INTRAVENOUS | Status: DC | PRN
  Administered 2020-12-29: 70 mg via INTRAVENOUS

## 2020-12-29 MED ORDER — PHENYLEPHRINE HCL (PRESSORS) 10 MG/ML IV SOLN
INTRAVENOUS | Status: AC
  Filled 2020-12-29: qty 1

## 2020-12-29 MED ORDER — PROPOFOL 500 MG/50ML IV EMUL
INTRAVENOUS | Status: DC | PRN
  Administered 2020-12-29: 150 ug/kg/min via INTRAVENOUS

## 2020-12-29 MED ORDER — INDOCYANINE GREEN 25 MG IV SOLR
2.5000 mg | Freq: Once | INTRAVENOUS | Status: AC
  Administered 2020-12-29: 2.5 mg via INTRAVENOUS
  Filled 2020-12-29: qty 1

## 2020-12-29 MED ORDER — FENTANYL CITRATE (PF) 100 MCG/2ML IJ SOLN
INTRAMUSCULAR | Status: AC
  Filled 2020-12-29: qty 2

## 2020-12-29 SURGICAL SUPPLY — 50 items
CANNULA REDUC XI 12-8 STAPL (CANNULA) ×1
CANNULA REDUCER 12-8 DVNC XI (CANNULA) ×1 IMPLANT
CHLORAPREP W/TINT 26 (MISCELLANEOUS) ×2 IMPLANT
CLIP LIGATING HEMO O LOK GREEN (MISCELLANEOUS) ×2 IMPLANT
DECANTER SPIKE VIAL GLASS SM (MISCELLANEOUS) ×2 IMPLANT
DEFOGGER SCOPE WARMER CLEARIFY (MISCELLANEOUS) ×2 IMPLANT
DERMABOND ADVANCED (GAUZE/BANDAGES/DRESSINGS) ×1
DERMABOND ADVANCED .7 DNX12 (GAUZE/BANDAGES/DRESSINGS) ×1 IMPLANT
DRAPE ARM DVNC X/XI (DISPOSABLE) ×4 IMPLANT
DRAPE COLUMN DVNC XI (DISPOSABLE) ×1 IMPLANT
DRAPE DA VINCI XI ARM (DISPOSABLE) ×4
DRAPE DA VINCI XI COLUMN (DISPOSABLE) ×1
ELECT CAUTERY BLADE 6.4 (BLADE) ×2 IMPLANT
ELECT REM PT RETURN 9FT ADLT (ELECTROSURGICAL) ×2
ELECTRODE REM PT RTRN 9FT ADLT (ELECTROSURGICAL) ×1 IMPLANT
GAUZE 4X4 16PLY ~~LOC~~+RFID DBL (SPONGE) ×2 IMPLANT
GLOVE SURG ENC MOIS LTX SZ7 (GLOVE) ×4 IMPLANT
GOWN STRL REUS W/ TWL LRG LVL3 (GOWN DISPOSABLE) ×4 IMPLANT
GOWN STRL REUS W/TWL LRG LVL3 (GOWN DISPOSABLE) ×4
IRRIGATION STRYKERFLOW (MISCELLANEOUS) IMPLANT
IRRIGATOR STRYKERFLOW (MISCELLANEOUS)
IV NS 1000ML (IV SOLUTION)
IV NS 1000ML BAXH (IV SOLUTION) IMPLANT
KIT PINK PAD W/HEAD ARE REST (MISCELLANEOUS) ×2
KIT PINK PAD W/HEAD ARM REST (MISCELLANEOUS) ×1 IMPLANT
LABEL OR SOLS (LABEL) ×2 IMPLANT
MANIFOLD NEPTUNE II (INSTRUMENTS) ×2 IMPLANT
NEEDLE HYPO 22GX1.5 SAFETY (NEEDLE) ×2 IMPLANT
NS IRRIG 500ML POUR BTL (IV SOLUTION) ×2 IMPLANT
OBTURATOR OPTICAL STANDARD 8MM (TROCAR) ×1
OBTURATOR OPTICAL STND 8 DVNC (TROCAR) ×1
OBTURATOR OPTICALSTD 8 DVNC (TROCAR) ×1 IMPLANT
PACK LAP CHOLECYSTECTOMY (MISCELLANEOUS) ×2 IMPLANT
PENCIL ELECTRO HAND CTR (MISCELLANEOUS) ×2 IMPLANT
POUCH SPECIMEN RETRIEVAL 10MM (ENDOMECHANICALS) ×2 IMPLANT
SEAL CANN UNIV 5-8 DVNC XI (MISCELLANEOUS) ×3 IMPLANT
SEAL XI 5MM-8MM UNIVERSAL (MISCELLANEOUS) ×3
SET TUBE SMOKE EVAC HIGH FLOW (TUBING) ×2 IMPLANT
SOLUTION ELECTROLUBE (MISCELLANEOUS) ×2 IMPLANT
SPONGE T-LAP 18X18 ~~LOC~~+RFID (SPONGE) ×2 IMPLANT
SPONGE T-LAP 4X18 ~~LOC~~+RFID (SPONGE) IMPLANT
STAPLER CANNULA SEAL DVNC XI (STAPLE) ×1 IMPLANT
STAPLER CANNULA SEAL XI (STAPLE) ×1
SUT MNCRL AB 4-0 PS2 18 (SUTURE) ×2 IMPLANT
SUT VICRYL 0 AB UR-6 (SUTURE) ×4 IMPLANT
SYR 20ML LL LF (SYRINGE) ×2 IMPLANT
SYR 30ML LL (SYRINGE) ×2 IMPLANT
TAPE TRANSPORE STRL 2 31045 (GAUZE/BANDAGES/DRESSINGS) ×2 IMPLANT
TROCAR BALLN GELPORT 12X130M (ENDOMECHANICALS) ×2 IMPLANT
WATER STERILE IRR 500ML POUR (IV SOLUTION) ×2 IMPLANT

## 2020-12-29 NOTE — Progress Notes (Signed)
[place] Hospital Day(s): 1.   Post op day(s): Day of Surgery.   Interval History:  Patient seen and examined No acute events or new complaints overnight.  Patient reports he is doing well this morning He denied any further abdominal pain No fever, chills nausea, emesis Renal function normal; sCr - 0.89; UO - 250 ccs + unmeasured Mild hypokalemia to 3.3 Currently on Zosyn NPO for planned cholecystectomy today   Vital signs in last 24 hours: [min-max] current  Temp:  [98.1 F (36.7 C)-99.7 F (37.6 C)] 98.7 F (37.1 C) (10/03 0802) Pulse Rate:  [83-106] 97 (10/03 0802) Resp:  [16-18] 18 (10/03 0802) BP: (108-154)/(65-85) 131/65 (10/03 0802) SpO2:  [92 %-98 %] 92 % (10/03 0802) Weight:  [77.1 kg] 77.1 kg (10/02 1132)     Height: 5\' 4"  (162.6 cm) Weight: 77.1 kg BMI (Calculated): 29.17   Intake/Output last 2 shifts:  10/02 0701 - 10/03 0700 In: 1543.8 [I.V.:1475; IV Piggyback:68.8] Out: 250 [Urine:250]   Physical Exam:  Constitutional: alert, cooperative and no distress  Respiratory: breathing non-labored at rest  Cardiovascular: regular rate and sinus rhythm  Gastrointestinal: Soft, non-tender, non-distended, no rebound/guarding Integumentary: warm, dry  Labs:  CBC Latest Ref Rng & Units 12/28/2020 04/20/2019  WBC 4.0 - 10.5 K/uL 14.4(H) 7.7  Hemoglobin 13.0 - 17.0 g/dL 13.7 13.0  Hematocrit 39.0 - 52.0 % 39.7 39.5  Platelets 150 - 400 K/uL 267 278.0   CMP Latest Ref Rng & Units 12/29/2020 12/28/2020 04/20/2019  Glucose 70 - 99 mg/dL 142(H) 164(H) 133(H)  BUN 8 - 23 mg/dL 20 25(H) 14  Creatinine 0.61 - 1.24 mg/dL 0.89 1.05 0.93  Sodium 135 - 145 mmol/L 132(L) 134(L) 141  Potassium 3.5 - 5.1 mmol/L 3.3(L) 3.8 3.7  Chloride 98 - 111 mmol/L 100 97(L) 105  CO2 22 - 32 mmol/L 22 20(L) 27  Calcium 8.9 - 10.3 mg/dL 8.4(L) 9.3 9.5  Total Protein 6.5 - 8.1 g/dL - 7.5 -  Total Bilirubin 0.3 - 1.2 mg/dL - 1.2 -  Alkaline Phos 38 -  126 U/L - 81 -  AST 15 - 41 U/L - 41 -  ALT 0 - 44 U/L - 35 -     Imaging studies: No new pertinent imaging studies   Assessment/Plan: (ICD-10's: K81.0) 75 y.o. male with acute cholecystitis   - Plan for robotic assisted laparoscopic cholecystectomy with Dr Dahlia Byes this afternoon pending OR/Anesthesia availability - All risks, benefits, and alternatives to above procedure(s) were discussed with the patient, all of his questions were answered to his expressed satisfaction, patient expresses he wishes to proceed, and informed consent was obtained.    - NPO + IVF Resuscitation - IV Abx (Zosyn) - Replete K+; monitor    - Monitor abdominal examination; on-going bowel function - Pain control prn; antiemetics prn    All of the above findings and recommendations were discussed with the patient, and the medical team, and all of patient's questions were answered to his expressed satisfaction.  -- Edison Simon, PA-C [facility] 12/29/2020, 8:39 AM [phone] M-F: 7am - 4pm \

## 2020-12-29 NOTE — Op Note (Signed)
[facility] Gastroenterology Patient Name: Dale Jacobs Procedure Date: 12/29/2020 2:09 PM MRN: [id number] Account #: [id number] Date of Birth: 19-May-1945 Admit Type: Inpatient Age: 75 Room: [facility] ENDO ROOM 2 Gender: Male Note Status: Finalized Instrument Name: Upper Endoscope [id number] Procedure:             Upper GI endoscopy Indications:           Melena Providers:             [name] MD, MD Medicines:             Monitored Anesthesia Care Complications:         No immediate complications. Procedure:             Pre-Anesthesia Assessment:                        - Prior to the procedure, a History and Physical was                         performed, and patient medications, allergies and                         sensitivities were reviewed. The patient's tolerance                         of previous anesthesia was reviewed.                        - The risks and benefits of the procedure and the                         sedation options and risks were discussed with the                         patient. All questions were answered and informed                         consent was obtained.                        - ASA Grade Assessment: III - A patient with severe                         systemic disease.                        After obtaining informed consent, the endoscope was                         passed under direct vision. Throughout the procedure,                         the patient's blood pressure, pulse, and oxygen                         saturations were monitored continuously. The Endoscope                         was introduced through the mouth, and advanced to the  third part of duodenum. The upper GI endoscopy was                         accomplished with ease. The patient tolerated the                         procedure well. Findings:      The esophagus was normal.      The stomach was normal.      The cardia and gastric  fundus were normal on retroflexion.      Many semi-sessile polyps were found in the entire duodenum. The polyps       were medium in size. Impression:            - Normal esophagus.                        - Normal stomach.                        - Duodenal polyposis.                        - No specimens collected. Recommendation:        - Return patient to hospital ward for ongoing care.                        - 1. Monitor CBC and trasfuse if needed                        2. Likely dark stool from iron tablets                        3. If hb drops and stool truly appears like tarry                         black in color then will need colonoscopy Procedure Code(s):     --- Professional ---                        [phone], Esophagogastroduodenoscopy, flexible,                         transoral; diagnostic, including collection of                         specimen(s) by brushing or washing, when performed                         (separate procedure) Diagnosis Code(s):     --- Professional ---                        K31.7, Polyp of stomach and duodenum                        K92.1, Melena (includes Hematochezia) CPT copyright 2019 [employer]. All rights reserved. The codes documented in this report are preliminary and upon coder review may  be revised to meet current compliance requirements. [name], MD [name] MD, MD 12/29/2020 3:21:28 PM This report has been signed electronically. Number of Addenda: 0 Note Initiated  On: 12/29/2020 2:09 PM Estimated Blood Loss:  Estimated blood loss: none.      [facility]

## 2020-12-29 NOTE — Anesthesia Preprocedure Evaluation (Deleted)
Anesthesia Evaluation  Patient identified by MRN, date of birth, ID band Patient awake    Reviewed: Allergy & Precautions, NPO status , Patient's Chart, lab work & pertinent test results  History of Anesthesia Complications Negative for: history of anesthetic complications  Airway Mallampati: II   Neck ROM: Full    Dental  (+) Edentulous Upper, Edentulous Lower   Pulmonary Current Smoker and Patient abstained from smoking.,    Pulmonary exam normal breath sounds clear to auscultation       Cardiovascular hypertension, Normal cardiovascular exam Rhythm:Regular Rate:Normal     Neuro/Psych negative neurological ROS     GI/Hepatic GERD  ,  Endo/Other  diabetes  Renal/GU negative Renal ROS     Musculoskeletal   Abdominal   Peds  Hematology negative hematology ROS (+)   Anesthesia Other Findings   Reproductive/Obstetrics                             Anesthesia Physical Anesthesia Plan  ASA: 2  Anesthesia Plan: General   Post-op Pain Management:    Induction: Intravenous  PONV Risk Score and Plan: 1 and Ondansetron, Dexamethasone and Treatment may vary due to age or medical condition  Airway Management Planned: Oral ETT  Additional Equipment:   Intra-op Plan:   Post-operative Plan: Extubation in OR  Informed Consent: I have reviewed the patients History and Physical, chart, labs and discussed the procedure including the risks, benefits and alternatives for the proposed anesthesia with the patient or authorized representative who has indicated his/her understanding and acceptance.       Plan Discussed with: CRNA  Anesthesia Plan Comments:         Anesthesia Quick Evaluation

## 2020-12-29 NOTE — Transfer of Care (Signed)
Immediate Anesthesia Transfer of Care Note  Patient: Dale Jacobs  Procedure(s) Performed: ESOPHAGOGASTRODUODENOSCOPY (EGD) WITH PROPOFOL  Patient Location: Endoscopy Unit  Anesthesia Type:General  Level of Consciousness: drowsy  Airway & Oxygen Therapy: Patient Spontanous Breathing  Post-op Assessment: Report given to RN and Post -op Vital signs reviewed and stable  Post vital signs: Reviewed and stable  Last Vitals:  Vitals Value Taken Time  BP 121/69 12/29/20 1521  Temp    Pulse 99 12/29/20 1522  Resp 8 12/29/20 1522  SpO2 91 % 12/29/20 1522  Vitals shown include unvalidated device data.  Last Pain:  Vitals:   12/29/20 1445  TempSrc: Temporal  PainSc:       Patients Stated Pain Goal: 0 (69/67/89 3810)  Complications: No notable events documented.

## 2020-12-29 NOTE — Progress Notes (Signed)
Dr Vicente Males spoke with the patient at the bedside regarding findings of Upper Endoscopy.  Patient is aware of the plan to perform Colonoscopy tomorrow with Dr Vicente Males and that he will need to complete bowel prep this evening.

## 2020-12-29 NOTE — Consult Note (Signed)
[name] , MD [address] [place], [place], [place], [address] [address], [place], [place], [place], [address] Phone: [phone]  Fax: [phone]  Consultation  Referring Provider: Dr. Dahlia Byes Primary Care Physician:  Marguerita Merles, MD Primary Gastroenterologist:  Dr. Rush Landmark       Reason for Consultation:     Melena  Date of Admission:  12/28/2020 Date of Consultation:  12/29/2020         HPI:   Dale Jacobs is a 75 y.o. male who has a history of neuroendocrine tumor, gastric ulcer.  History of EUS March 2021.  Multiple submucosal nodule seen between 5 to 16 mm in the duodenal bulb.  Round intramural lesion seen in the duodenal bulb.  11.2 x 9 x 9 0.9 x 7.6 mm.  Suspicious for multifocal neuroendocrine tumor.   12/28/2020 right upper quadrant ultrasound shows gallbladder wall thickening and stones trace pericholecystic fluid.  CT abdomen pelvis with contrast shows circumferential wall thickening of the rectosigmoid colon consistent with colitis.  Cirrhosis.  He was being prepped for a cholecystectomy today and in the PACU was noted to have melena.  And I have been called to see him.  When I went to see him in the PACU he denied any abdominal pain.  No hematemesis.  He had not paid special attention to his stools.  He has been on oral iron tablets.  The nurse informed that his stools were dark in color greenish-black in color to be precise.  He was comfortable and not in any distress.  Past Medical History:  Diagnosis Date  . Diabetes mellitus without complication ([place])    Type II  . GERD (gastroesophageal reflux disease)   . Hyperlipemia   . Hypertension     Past Surgical History:  Procedure Laterality Date  . BIOPSY  06/20/2019   Procedure: BIOPSY;  Surgeon: Irving Copas., MD;  Location: [facility];  Service: Gastroenterology;;  . COLONOSCOPY WITH PROPOFOL N/A 10/03/2018   Procedure: COLONOSCOPY WITH PROPOFOL;  Surgeon: Lucilla Lame, MD;   Location: [facility] ENDOSCOPY;  Service: Endoscopy;  Laterality: N/A;  . ESOPHAGOGASTRODUODENOSCOPY (EGD) WITH PROPOFOL N/A 10/03/2018   Procedure: ESOPHAGOGASTRODUODENOSCOPY (EGD) WITH PROPOFOL;  Surgeon: Lucilla Lame, MD;  Location: [facility] ENDOSCOPY;  Service: Endoscopy;  Laterality: N/A;  . ESOPHAGOGASTRODUODENOSCOPY (EGD) WITH PROPOFOL N/A 06/20/2019   Procedure: ESOPHAGOGASTRODUODENOSCOPY (EGD) WITH PROPOFOL;  Surgeon: Rush Landmark Telford Nab., MD;  Location: [facility] ENDOSCOPY;  Service: Gastroenterology;  Laterality: N/A;  . EUS N/A 06/20/2019   Procedure: UPPER ENDOSCOPIC ULTRASOUND (EUS) RADIAL;  Surgeon: Rush Landmark Telford Nab., MD;  Location: [facility] ENDOSCOPY;  Service: Gastroenterology;  Laterality: N/A;  . GIVENS CAPSULE STUDY N/A 11/06/2018   Procedure: GIVENS CAPSULE STUDY;  Surgeon: Lucilla Lame, MD;  Location: [facility] ENDOSCOPY;  Service: Endoscopy;  Laterality: N/A;  . POLYPECTOMY  06/20/2019   Procedure: POLYPECTOMY;  Surgeon: Rush Landmark Telford Nab., MD;  Location: [facility] ENDOSCOPY;  Service: Gastroenterology;;    Prior to Admission medications   Medication Sig Start Date End Date Taking? Authorizing Provider  amLODipine (NORVASC) 5 MG tablet Take 5 mg by mouth daily. 10/28/20  Yes [provider]  aspirin EC 81 MG tablet Take 81 mg by mouth daily.   Yes [provider]  atorvastatin (LIPITOR) 40 MG tablet Take 40 mg by mouth at bedtime.    Yes [provider]  cholecalciferol (VITAMIN D3) 25 MCG (1000 UNIT) tablet Take 1,000 Units by mouth daily.   Yes [provider]  ferrous sulfate 325 (65 FE) MG tablet Take 325 mg by mouth 2 (two) times daily.    Yes [provider]  GLIPIZIDE XL 10 MG 24 hr tablet Take 10 mg by mouth 2 (two) times daily.  09/07/18  Yes [provider]  hydrochlorothiazide (HYDRODIURIL) 25 MG tablet Take 25 mg by mouth daily.  09/07/18  Yes [provider]  lisinopril (ZESTRIL) 40 MG tablet Take 40 mg by mouth daily.  08/01/18   Yes [provider]  metFORMIN (GLUCOPHAGE) 500 MG tablet Take 1,000 mg by mouth 2 (two) times daily.  09/07/18  Yes [provider]  omeprazole (PRILOSEC) 40 MG capsule Take 1 capsule (40 mg total) by mouth daily. Take before breakfast or before dinner. 06/20/19 12/28/20 Yes Mansouraty, Telford Nab., MD  Accu-Chek FastClix Lancets MISC  04/07/18   [provider]    Family History  Problem Relation Age of Onset  . Heart disease Father   . Colon cancer Neg Hx   . Liver cancer Neg Hx   . Pancreatic cancer Neg Hx   . Prostate cancer Neg Hx   . Rectal cancer Neg Hx   . Stomach cancer Neg Hx   . Inflammatory bowel disease Neg Hx   . Esophageal cancer Neg Hx      Social History   Tobacco Use  . Smoking status: Some Days    Types: Cigars  . Smokeless tobacco: Never  . Tobacco comments:    not often, "maybe 1  time a month"  Vaping Use  . Vaping Use: Never used  Substance Use Topics  . Alcohol use: Never  . Drug use: Not Currently    Allergies as of 12/28/2020 - Review Complete 12/28/2020  Allergen Reaction Noted  . Tradjenta [linagliptin]  09/12/2018    Review of Systems:    All systems reviewed and negative except where noted in HPI.   Physical Exam:  Vital signs in last 24 hours: Temp:  [97.6 F (36.4 C)-100 F (37.8 C)] 100 F (37.8 C) (10/03 1214) Pulse Rate:  [83-106] 99 (10/03 1214) Resp:  [16-20] 18 (10/03 1214) BP: (108-154)/(65-85) 143/71 (10/03 1214) SpO2:  [91 %-98 %] 94 % (10/03 1214) Weight:  [77.1 kg] 77.1 kg (10/03 0955)   General:   Pleasant, cooperative in NAD Head:  Normocephalic and atraumatic. Eyes:   No icterus.   Conjunctiva pink. PERRLA. Ears:  Normal auditory acuity. Neck:  Supple; no masses or thyroidomegaly Lungs: Respirations even and unlabored. Lungs clear to auscultation bilaterally.   No wheezes, crackles, or rhonchi.  Heart:  Regular rate and rhythm;  Without murmur, clicks, rubs or gallops Abdomen:  Soft,  nondistended, nontender. Normal bowel sounds. No appreciable masses or hepatomegaly.  No rebound or guarding.  Neurologic:  Alert and oriented x3;  grossly normal neurologically. Skin:  Intact without significant lesions or rashes. Cervical Nodes:  No significant cervical adenopathy. Psych:  Alert and cooperative. Normal affect.  LAB RESULTS: Recent Labs    12/28/20 1134 12/29/20 1146  WBC 14.4* 19.2*  HGB 13.7 12.1*  HCT 39.7 34.6*  PLT 267 250   BMET Recent Labs    12/28/20 1134 12/29/20 0406  NA 134* 132*  K 3.8 3.3*  CL 97* 100  CO2 20* 22  GLUCOSE 164* 142*  BUN 25* 20  CREATININE 1.05 0.89  CALCIUM 9.3 8.4*   LFT Recent Labs    12/28/20 1134  PROT 7.5  ALBUMIN 4.0  AST 41  ALT  35  ALKPHOS 81  BILITOT 1.2   PT/INR No results for input(s): LABPROT, INR in the last 72 hours.  STUDIES: CT ABDOMEN PELVIS W CONTRAST  Result Date: 12/28/2020 CLINICAL DATA:  Generalized abdominal pain with decreased appetite, constipation, and loose stools. EXAM: CT ABDOMEN AND PELVIS WITH CONTRAST TECHNIQUE: Multidetector CT imaging of the abdomen and pelvis was performed using the standard protocol following bolus administration of intravenous contrast. CONTRAST:  55mL OMNIPAQUE IOHEXOL 350 MG/ML SOLN COMPARISON:  CT abdomen pelvis dated November 08, 2018. FINDINGS: Lower chest: No acute abnormality. Unchanged linear scarring in the right lower lobe. Hepatobiliary: Unchanged nodular hepatic contour and scattered punctate calcified granulomas. Distended gallbladder with multiple layering small gallstones. Limited evaluation for gallbladder wall thickening due to motion artifact. No biliary dilatation. Pancreas: Unremarkable. No pancreatic ductal dilatation or surrounding inflammatory changes. Spleen: Normal in size without focal abnormality. Adrenals/Urinary Tract: Adrenal glands are unremarkable. Kidneys are normal, without renal calculi, focal lesion, or hydronephrosis. Bladder is  unremarkable. Stomach/Bowel: Mild circumferential wall thickening of the rectosigmoid colon. The stomach and small bowel are unremarkable. Normal appendix. Vascular/Lymphatic: No significant vascular findings are present. No enlarged abdominal or pelvic lymph nodes. Reproductive: Borderline prostatomegaly. Other: No abdominal wall hernia or abnormality. Trace free fluid in the pelvis. No pneumoperitoneum. Musculoskeletal: No acute or significant osseous findings. IMPRESSION: 1. Mild circumferential wall thickening of the rectosigmoid colon, consistent with colitis. 2. Unchanged cirrhosis. 3. Cholelithiasis. Limited evaluation for gallbladder wall thickening due to motion artifact. If there is clinical concern for cholecystitis, recommend right upper quadrant ultrasound for further evaluation. Electronically Signed   By: Titus Dubin M.D.   On: 12/28/2020 14:00   US Abdomen Limited RUQ (LIVER/GB)  Result Date: 12/28/2020 CLINICAL DATA:  RIGHT UPPER QUADRANT pain for 1 week. EXAM: ULTRASOUND ABDOMEN LIMITED RIGHT UPPER QUADRANT COMPARISON:  CT of the abdomen and pelvis on 12/28/2020 FINDINGS: Gallbladder: Gallbladder wall is thickened, 5.0 millimeters. Numerous stones are seen, largest measuring approximately 7 millimeters. No sonographic Murphy sign. Note is made of trace pericholecystic fluid and gallbladder sludge. Common bile duct: Diameter: 4.3 millimeters Liver: Multiple hypoechoic masses in the liver, measuring up to 1.0 centimeters and not fully characterized. Scattered calcified granulomata. No suspicious liver lesion. Portal vein is patent on color Doppler imaging with normal direction of blood flow towards the liver. Other: None. IMPRESSION: 1. Gallbladder wall thickening and stones, trace pericholecystic fluid. Despite the lack of sonographic Murphy sign, acute cholecystitis should be considered. 2. Small hepatic lesions measuring 1 centimeter and less., statistically likely representing benign  lesions. These are too small fully characterize. Electronically Signed   By: Nolon Nations M.D.   On: 12/28/2020 15:13      Impression / Plan:   Dale Jacobs is a 75 y.o. y/o male who was in the process to undergo a cholecystectomy was noted to have black stools in the PACU and I was called to evaluate for a GI bleed.  Denies any hematemesis or prior melena. Differentials of black stools related to iron intake versus bleeding which would include prior history of gastric ulceration, neuroendocrine tumor of the duodenum which could be bleeding. This morning there is no elevation in the BUN/creatinine ratio.  Plan 1.  IV PPI and octreotide 2.  Continue antibiotics as his history of cirrhosis. 3.  EGD at around 3 PM today. 4.  Monitor CBC and transfuse as needed. 5.  I have discussed the plan with Dr. Dahlia Byes Thank you for involving me in the  care of this patient.      LOS: 1 day   [name], MD  12/29/2020, 12:47 PM

## 2020-12-29 NOTE — H&P (Signed)
EGD no bleeding seen: Discussed with Dr Dahlia Byes and plan for colonoscopy tomorrow   Dr [name] MD,MRCP [facility]) Gastroenterology/Hepatology Pager: [phone]

## 2020-12-29 NOTE — H&P (Signed)
Dale Bellows, MD [address], [place], [place], [place], [address] [address], [place], [place], [place], [address] Phone: [phone]  Fax: [phone]  Primary Care Physician:  Marguerita Merles, MD   Pre-Procedure History & Physical: HPI:  Dale Jacobs is a 75 y.o. male is here for an endoscopy    Past Medical History:  Diagnosis Date   Diabetes mellitus without complication ([place])    Type II   GERD (gastroesophageal reflux disease)    Hyperlipemia    Hypertension     Past Surgical History:  Procedure Laterality Date   BIOPSY  06/20/2019   Procedure: BIOPSY;  Surgeon: Irving Copas., MD;  Location: [facility];  Service: Gastroenterology;;   COLONOSCOPY WITH PROPOFOL N/A 10/03/2018   Procedure: COLONOSCOPY WITH PROPOFOL;  Surgeon: Lucilla Lame, MD;  Location: [facility] ENDOSCOPY;  Service: Endoscopy;  Laterality: N/A;   ESOPHAGOGASTRODUODENOSCOPY (EGD) WITH PROPOFOL N/A 10/03/2018   Procedure: ESOPHAGOGASTRODUODENOSCOPY (EGD) WITH PROPOFOL;  Surgeon: Lucilla Lame, MD;  Location: [facility] ENDOSCOPY;  Service: Endoscopy;  Laterality: N/A;   ESOPHAGOGASTRODUODENOSCOPY (EGD) WITH PROPOFOL N/A 06/20/2019   Procedure: ESOPHAGOGASTRODUODENOSCOPY (EGD) WITH PROPOFOL;  Surgeon: [facility] Telford Nab., MD;  Location: [facility] ENDOSCOPY;  Service: Gastroenterology;  Laterality: N/A;   EUS N/A 06/20/2019   Procedure: UPPER ENDOSCOPIC ULTRASOUND (EUS) RADIAL;  Surgeon: Irving Copas., MD;  Location: [facility] ENDOSCOPY;  Service: Gastroenterology;  Laterality: N/A;   GIVENS CAPSULE STUDY N/A 11/06/2018   Procedure: GIVENS CAPSULE STUDY;  Surgeon: Lucilla Lame, MD;  Location: [facility] ENDOSCOPY;  Service: Endoscopy;  Laterality: N/A;   POLYPECTOMY  06/20/2019   Procedure: POLYPECTOMY;  Surgeon: Rush Landmark Telford Nab., MD;  Location: [facility];  Service: Gastroenterology;;    Prior to Admission medications   Medication Sig Start Date End Date Taking? Authorizing Provider  amLODipine  (NORVASC) 5 MG tablet Take 5 mg by mouth daily. 10/28/20  Yes [provider]  aspirin EC 81 MG tablet Take 81 mg by mouth daily.   Yes [provider]  atorvastatin (LIPITOR) 40 MG tablet Take 40 mg by mouth at bedtime.    Yes [provider]  cholecalciferol (VITAMIN D3) 25 MCG (1000 UNIT) tablet Take 1,000 Units by mouth daily.   Yes [provider]  ferrous sulfate 325 (65 FE) MG tablet Take 325 mg by mouth 2 (two) times daily.    Yes [provider]  GLIPIZIDE XL 10 MG 24 hr tablet Take 10 mg by mouth 2 (two) times daily.  09/07/18  Yes [provider]  hydrochlorothiazide (HYDRODIURIL) 25 MG tablet Take 25 mg by mouth daily.  09/07/18  Yes [provider]  lisinopril (ZESTRIL) 40 MG tablet Take 40 mg by mouth daily.  08/01/18  Yes [provider]  metFORMIN (GLUCOPHAGE) 500 MG tablet Take 1,000 mg by mouth 2 (two) times daily.  09/07/18  Yes [provider]  omeprazole (PRILOSEC) 40 MG capsule Take 1 capsule (40 mg total) by mouth daily. Take before breakfast or before dinner. 06/20/19 12/28/20 Yes Mansouraty, Telford Nab., MD  Accu-Chek FastClix Lancets MISC  04/07/18   [provider]    Allergies as of 12/28/2020 - Review Complete 12/28/2020  Allergen Reaction Noted   Tradjenta [linagliptin]  09/12/2018    Family History  Problem Relation Age of Onset   Heart disease Father    Colon cancer Neg Hx    Liver cancer Neg Hx    Pancreatic cancer Neg Hx    Prostate  cancer Neg Hx    Rectal cancer Neg Hx    Stomach cancer Neg Hx    Inflammatory bowel disease Neg Hx    Esophageal cancer Neg Hx     Social History   Socioeconomic History   Marital status: Single    Spouse name: Not on file   Number of children: Not on file   Years of education: Not on file   Highest education level: Not on file  Occupational History   Not on file  Tobacco Use   Smoking status: Some Days    Types: Cigars   Smokeless  tobacco: Never   Tobacco comments:    not often, "maybe 1  time a month"  Vaping Use   Vaping Use: Never used  Substance and Sexual Activity   Alcohol use: Never   Drug use: Not Currently   Sexual activity: Not on file  Other Topics Concern   Not on file  Social History Narrative   Not on file   Social Determinants of Health   Financial Resource Strain: Not on file  Food Insecurity: Not on file  Transportation Needs: Not on file  Physical Activity: Not on file  Stress: Not on file  Social Connections: Not on file  Intimate Partner Violence: Not on file    Review of Systems: See HPI, otherwise negative ROS  Physical Exam: BP 124/70   Pulse 98   Temp (!) 97.3 F (36.3 C) (Temporal)   Resp 17   Ht 5\' 4"  (1.626 m)   Wt 77.1 kg   SpO2 97%   BMI 29.18 kg/m  General:   Alert,  pleasant and cooperative in NAD Head:  Normocephalic and atraumatic. Neck:  Supple; no masses or thyromegaly. Lungs:  Clear throughout to auscultation, normal respiratory effort.    Heart:  +S1, +S2, Regular rate and rhythm, No edema. Abdomen:  Soft, nontender and nondistended. Normal bowel sounds, without guarding, and without rebound.   Neurologic:  Alert and  oriented x4;  grossly normal neurologically.  Impression/Plan: [name] is here for an endoscopy  to be performed for  evaluation of gi bleed/melena    Risks, benefits, limitations, and alternatives regarding endoscopy have been reviewed with the patient.  Questions have been answered.  All parties agreeable.   Dale Bellows, MD  12/29/2020, 3:00 PM

## 2020-12-29 NOTE — Progress Notes (Addendum)
Pt seen and examined while in PACU he started having multiple episode of melena. HD appropiate. We will need to make sure that his GI bleed is under controlled before cholecystectomy.  He is not peritonitic and he is mentating well. Haiku message sent to Dr. Vicente Males who is on call. Heparin sub q and toradol prn discontinued

## 2020-12-29 NOTE — Anesthesia Preprocedure Evaluation (Addendum)
Anesthesia Evaluation  Patient identified by MRN, date of birth, ID band Patient awake    Reviewed: Allergy & Precautions, NPO status , Patient's Chart, lab work & pertinent test results  History of Anesthesia Complications Negative for: history of anesthetic complications  Airway Mallampati: II   Neck ROM: Full    Dental  (+) Edentulous Upper, Edentulous Lower   Pulmonary Current Smoker (smokes 1/2 cigar daily) and Patient abstained from smoking.,    Pulmonary exam normal breath sounds clear to auscultation       Cardiovascular hypertension, Normal cardiovascular exam Rhythm:Regular Rate:Normal     Neuro/Psych negative neurological ROS     GI/Hepatic GERD  ,  Endo/Other  diabetes  Renal/GU negative Renal ROS     Musculoskeletal   Abdominal   Peds  Hematology negative hematology ROS (+)   Anesthesia Other Findings   Reproductive/Obstetrics                            Anesthesia Physical  Anesthesia Plan  ASA: 2  Anesthesia Plan: General   Post-op Pain Management:    Induction: Intravenous  PONV Risk Score and Plan: 1 and Treatment may vary due to age or medical condition, Propofol infusion and TIVA  Airway Management Planned: Natural Airway  Additional Equipment:   Intra-op Plan:   Post-operative Plan:   Informed Consent: I have reviewed the patients History and Physical, chart, labs and discussed the procedure including the risks, benefits and alternatives for the proposed anesthesia with the patient or authorized representative who has indicated his/her understanding and acceptance.       Plan Discussed with: CRNA  Anesthesia Plan Comments:        Anesthesia Quick Evaluation

## 2020-12-30 ENCOUNTER — Encounter: Payer: Self-pay | Admitting: Anesthesiology

## 2020-12-30 ENCOUNTER — Encounter: Payer: Self-pay | Admitting: Gastroenterology

## 2020-12-30 DIAGNOSIS — K8000 Calculus of gallbladder with acute cholecystitis without obstruction: Secondary | ICD-10-CM | POA: Diagnosis not present

## 2020-12-30 LAB — CBC
HCT: 29.8 % — ABNORMAL LOW (ref 39.0–52.0)
Hemoglobin: 10.4 g/dL — ABNORMAL LOW (ref 13.0–17.0)
MCH: 32 pg (ref 26.0–34.0)
MCHC: 34.9 g/dL (ref 30.0–36.0)
MCV: 91.7 fL (ref 80.0–100.0)
Platelets: 228 10*3/uL (ref 150–400)
RBC: 3.25 MIL/uL — ABNORMAL LOW (ref 4.22–5.81)
RDW: 12.7 % (ref 11.5–15.5)
WBC: 13.1 10*3/uL — ABNORMAL HIGH (ref 4.0–10.5)
nRBC: 0 % (ref 0.0–0.2)

## 2020-12-30 LAB — GLUCOSE, CAPILLARY: Glucose-Capillary: 117 mg/dL — ABNORMAL HIGH (ref 70–99)

## 2020-12-30 LAB — COMPREHENSIVE METABOLIC PANEL
ALT: 33 U/L (ref 0–44)
AST: 48 U/L — ABNORMAL HIGH (ref 15–41)
Albumin: 2.8 g/dL — ABNORMAL LOW (ref 3.5–5.0)
Alkaline Phosphatase: 70 U/L (ref 38–126)
Anion gap: 7 (ref 5–15)
BUN: 18 mg/dL (ref 8–23)
CO2: 23 mmol/L (ref 22–32)
Calcium: 7.8 mg/dL — ABNORMAL LOW (ref 8.9–10.3)
Chloride: 108 mmol/L (ref 98–111)
Creatinine, Ser: 0.96 mg/dL (ref 0.61–1.24)
GFR, Estimated: >60 mL/min (ref >60)
Glucose, Bld: 114 mg/dL — ABNORMAL HIGH (ref 70–99)
Potassium: 2.7 mmol/L — CL (ref 3.5–5.1)
Sodium: 138 mmol/L (ref 135–145)
Total Bilirubin: 1.2 mg/dL (ref 0.3–1.2)
Total Protein: 5.9 g/dL — ABNORMAL LOW (ref 6.5–8.1)

## 2020-12-30 LAB — PROTIME-INR
INR: 1.3 — ABNORMAL HIGH (ref 0.8–1.2)
Prothrombin Time: 16.4 seconds — ABNORMAL HIGH (ref 11.4–15.2)

## 2020-12-30 MED ORDER — PEG 3350-KCL-NA BICARB-NACL 420 G PO SOLR
4000.0000 mL | Freq: Once | ORAL | Status: DC
  Filled 2020-12-30: qty 4000

## 2020-12-30 MED ORDER — POTASSIUM CHLORIDE 10 MEQ/100ML IV SOLN
10.0000 meq | INTRAVENOUS | Status: DC
  Administered 2020-12-30 (×2): 10 meq via INTRAVENOUS
  Filled 2020-12-30 (×2): qty 100

## 2020-12-30 MED ORDER — AMOXICILLIN-POT CLAVULANATE 875-125 MG PO TABS: 1.0000 | ORAL_TABLET | Freq: Two times a day (BID) | ORAL | 0 refills | Status: DC

## 2020-12-30 NOTE — Progress Notes (Signed)
Progress Note Notified by RN while in the operating room that the patient was signing out AMA due to a family emergency. RN staff attempted to keep patient here until I could speak to him, but that was unsuccessful. Patient signed out [place] before I could speak to him.   I will send him Rx for Abx to cover cholecystitis and attempt to arrange outpatient follow up for his cholecystitis and potential need for cholecystectomy in outpatient setting.   -- Edison Simon, PA-C [facility] 12/30/2020, 11:57 AM [phone] M-F: 7am - 4pm

## 2020-12-30 NOTE — Progress Notes (Signed)
Patient stated he needs to leave r/t his dying sister and needing to be at her bedside. This RN and Baxter Flattery, RN attempted to have patient wait until MD or PA could come talk to him; however, he was insistent on leaving at this time. PA Olean Ree aware, but in surgery at this time. Patient educated on importance of seeing his primary care provider for hypokalemia, and side effects and risks of hypokalemia. Patient signed AMA form; his IV was removed but refused vitals, and patient ambulated self through main [place] doors.

## 2020-12-30 NOTE — Progress Notes (Signed)
Patient had breakfast this morning . Colonoscopy rescheduled for tomorrow . 1 gallon bowel prep starting at 4-5 pm today . Clears till then    Dr [name] MD,MRCP [facility]) Gastroenterology/Hepatology Pager: [phone]

## 2020-12-30 NOTE — Progress Notes (Signed)
[facility] SURGICAL PROGRESS NOTE (cpt [phone])  Hospital Day(s): 2.   Interval History: Patient seen and examined, no acute events or new complaints overnight. Patient reports he continues to feel fine from an abdominal standpoint. He denies fever, chills, nausea, emesis, or abdominal pain. Leukocytosis is improved this morning; down to 13.1K. Renal function is normal; sCr - 0.96; UO - unmeasured. Hypokalemia to 2.7 this morning. He has continued on Zosyn. Currently NPO and tolerating bowel prep with multiple BMs recorded.   Initial plan for cholecystectomy yesterday was cancelled after patient mentioned melena. GI was consulted and underwent endoscopy which showed no evidence of bleeding. Given positive fecal occult card,plan for colonoscopy today pending correction of K+.    Review of Systems:  Constitutional: denies fever, chills  HEENT: denies cough or congestion  Respiratory: denies any shortness of breath  Cardiovascular: denies chest pain or palpitations  Gastrointestinal: denies abdominal pain, N/V Genitourinary: denies burning with urination or urinary frequency  Vital signs in last 24 hours: [min-max] current  Temp:  [97.3 F (36.3 C)-100 F (37.8 C)] 98.6 F (37 C) (10/04 0334) Pulse Rate:  [88-103] 91 (10/04 0334) Resp:  [17-26] 20 (10/04 0334) BP: (110-151)/(65-76) 119/70 (10/04 0334) SpO2:  [91 %-98 %] 94 % (10/04 0334) Weight:  [77.1 kg] 77.1 kg (10/03 1445)     Height: 5\' 4"  (162.6 cm) Weight: 77.1 kg BMI (Calculated): 29.16   Intake/Output last 2 shifts:  10/03 0701 - 10/04 0700 In: 9675 [P.O.:1360; I.V.:3414.8; IV Piggyback:116.2] Out: 2 [Stool:2]   Physical Exam:  Constitutional: alert, cooperative and no distress  HENT: normocephalic without obvious abnormality  Eyes: PERRL, EOM's grossly intact and symmetric  Respiratory: breathing non-labored at rest  Cardiovascular: regular rate and sinus rhythm  Gastrointestinal: soft, non-tender, and  non-distended, no rebound/guarding. Negative Murphy's Sign Musculoskeletal: no edema or wounds, motor and sensation grossly intact, NT    Labs:  CBC Latest Ref Rng & Units 12/30/2020 12/29/2020 12/28/2020  WBC 4.0 - 10.5 K/uL 13.1(H) 19.2(H) 14.4(H)  Hemoglobin 13.0 - 17.0 g/dL 10.4(L) 12.1(L) 13.7  Hematocrit 39.0 - 52.0 % 29.8(L) 34.6(L) 39.7  Platelets 150 - 400 K/uL 228 250 267   CMP Latest Ref Rng & Units 12/30/2020 12/29/2020 12/28/2020  Glucose 70 - 99 mg/dL 114(H) 142(H) 164(H)  BUN 8 - 23 mg/dL 18 20 25(H)  Creatinine 0.61 - 1.24 mg/dL 0.96 0.89 1.05  Sodium 135 - 145 mmol/L 138 132(L) 134(L)  Potassium 3.5 - 5.1 mmol/L 2.7(LL) 3.3(L) 3.8  Chloride 98 - 111 mmol/L 108 100 97(L)  CO2 22 - 32 mmol/L 23 22 20(L)  Calcium 8.9 - 10.3 mg/dL 7.8(L) 8.4(L) 9.3  Total Protein 6.5 - 8.1 g/dL 5.9(L) - 7.5  Total Bilirubin 0.3 - 1.2 mg/dL 1.2 - 1.2  Alkaline Phos 38 - 126 U/L 70 - 81  AST 15 - 41 U/L 48(H) - 41  ALT 0 - 44 U/L 33 - 35    Imaging studies: No new pertinent imaging studies   Assessment/Plan: (ICD-10's: K81.0) 75 y.o. male with acute cholecystitis, complicated by reports of melena pending colonoscopy               - Appreciate GI assistance; plan for colonoscopy today pending correction of K+             - NPO  for now + IVF Resuscitation - IV Abx (Zosyn) - Replete K+; monitor               -  Monitor abdominal examination; on-going bowel function - Pain control prn; antiemetics prn    - From gallbladder perspective, will continue to monitor. May need cholecystectomy on Thursday with Dr Dahlia Byes. If he fails to improve or deteriorates, then would need percutaneous cholecystostomy tube in the interim.   All of the above findings and recommendations were discussed with the patient, and the medical team, and all of patient's questions were answered to his expressed satisfaction.  -- Edison Simon, PA-C [facility] 12/30/2020, 7:32 AM [phone] M-F: 7am  - 4pm

## 2020-12-31 ENCOUNTER — Encounter: Payer: Self-pay | Admitting: Emergency Medicine

## 2020-12-31 ENCOUNTER — Emergency Department
Admission: EM | Admit: 2020-12-31 | Discharge: 2020-12-31 | Disposition: A | Discharge status: Home / Self Care | Payer: Medicare (Managed Care) | Attending: Emergency Medicine | Admitting: Emergency Medicine

## 2020-12-31 ENCOUNTER — Other Ambulatory Visit: Payer: Self-pay

## 2020-12-31 DIAGNOSIS — R109 Unspecified abdominal pain: Secondary | ICD-10-CM | POA: Diagnosis present

## 2020-12-31 DIAGNOSIS — Z7984 Long term (current) use of oral hypoglycemic drugs: Secondary | ICD-10-CM | POA: Insufficient documentation

## 2020-12-31 DIAGNOSIS — Z79899 Other long term (current) drug therapy: Secondary | ICD-10-CM | POA: Diagnosis not present

## 2020-12-31 DIAGNOSIS — I10 Essential (primary) hypertension: Secondary | ICD-10-CM | POA: Insufficient documentation

## 2020-12-31 DIAGNOSIS — Z7982 Long term (current) use of aspirin: Secondary | ICD-10-CM | POA: Diagnosis not present

## 2020-12-31 DIAGNOSIS — Z8589 Personal history of malignant neoplasm of other organs and systems: Secondary | ICD-10-CM | POA: Insufficient documentation

## 2020-12-31 DIAGNOSIS — R1084 Generalized abdominal pain: Secondary | ICD-10-CM

## 2020-12-31 DIAGNOSIS — K8050 Calculus of bile duct without cholangitis or cholecystitis without obstruction: Secondary | ICD-10-CM | POA: Insufficient documentation

## 2020-12-31 DIAGNOSIS — E876 Hypokalemia: Secondary | ICD-10-CM | POA: Diagnosis not present

## 2020-12-31 DIAGNOSIS — F17290 Nicotine dependence, other tobacco product, uncomplicated: Secondary | ICD-10-CM | POA: Insufficient documentation

## 2020-12-31 DIAGNOSIS — E119 Type 2 diabetes mellitus without complications: Secondary | ICD-10-CM | POA: Insufficient documentation

## 2020-12-31 LAB — URINALYSIS, COMPLETE (UACMP) WITH MICROSCOPIC
Bacteria, UA: NONE SEEN
Bilirubin Urine: NEGATIVE
Glucose, UA: >=500 mg/dL — AB
Ketones, ur: NEGATIVE mg/dL
Leukocytes,Ua: NEGATIVE
Nitrite: NEGATIVE
Protein, ur: 100 mg/dL — AB
Specific Gravity, Urine: 1.02 (ref 1.005–1.030)
Squamous Epithelial / HPF: NONE SEEN (ref 0–5)
pH: 5 (ref 5.0–8.0)

## 2020-12-31 LAB — CBC
HCT: 31.1 % — ABNORMAL LOW (ref 39.0–52.0)
Hemoglobin: 10.9 g/dL — ABNORMAL LOW (ref 13.0–17.0)
MCH: 31.9 pg (ref 26.0–34.0)
MCHC: 35 g/dL (ref 30.0–36.0)
MCV: 90.9 fL (ref 80.0–100.0)
Platelets: 268 10*3/uL (ref 150–400)
RBC: 3.42 MIL/uL — ABNORMAL LOW (ref 4.22–5.81)
RDW: 12.8 % (ref 11.5–15.5)
WBC: 10.3 10*3/uL (ref 4.0–10.5)
nRBC: 0 % (ref 0.0–0.2)

## 2020-12-31 LAB — COMPREHENSIVE METABOLIC PANEL
ALT: 34 U/L (ref 0–44)
AST: 55 U/L — ABNORMAL HIGH (ref 15–41)
Albumin: 3 g/dL — ABNORMAL LOW (ref 3.5–5.0)
Alkaline Phosphatase: 92 U/L (ref 38–126)
Anion gap: 8 (ref 5–15)
BUN: 16 mg/dL (ref 8–23)
CO2: 23 mmol/L (ref 22–32)
Calcium: 8.4 mg/dL — ABNORMAL LOW (ref 8.9–10.3)
Chloride: 106 mmol/L (ref 98–111)
Creatinine, Ser: 0.92 mg/dL (ref 0.61–1.24)
GFR, Estimated: >60 mL/min (ref >60)
Glucose, Bld: 219 mg/dL — ABNORMAL HIGH (ref 70–99)
Potassium: 2.8 mmol/L — ABNORMAL LOW (ref 3.5–5.1)
Sodium: 137 mmol/L (ref 135–145)
Total Bilirubin: 0.8 mg/dL (ref 0.3–1.2)
Total Protein: 6.6 g/dL (ref 6.5–8.1)

## 2020-12-31 LAB — LIPASE, BLOOD: Lipase: 56 U/L — ABNORMAL HIGH (ref 11–51)

## 2020-12-31 SURGERY — COLONOSCOPY WITH PROPOFOL
Anesthesia: General

## 2020-12-31 MED ORDER — AMOXICILLIN-POT CLAVULANATE 875-125 MG PO TABS: 1.0000 | ORAL_TABLET | Freq: Two times a day (BID) | ORAL | 0 refills | Status: AC

## 2020-12-31 MED ORDER — POTASSIUM CHLORIDE CRYS ER 20 MEQ PO TBCR
40.0000 meq | EXTENDED_RELEASE_TABLET | Freq: Once | ORAL | Status: AC
  Administered 2020-12-31: 40 meq via ORAL
  Filled 2020-12-31: qty 2

## 2020-12-31 MED ORDER — POTASSIUM CHLORIDE CRYS ER 20 MEQ PO TBCR: 20.0000 meq | EXTENDED_RELEASE_TABLET | Freq: Every day | ORAL | 0 refills | Status: AC

## 2020-12-31 NOTE — ED Triage Notes (Signed)
Pt comes into the ED via POV c/o abd pain.  Pt is scheduled for lap chole tomorrow.  Pt was admitted on 12/28/20 but left AMA yesterday due to his sister dying.  HE informed the staff that he would come back today.  Pt denies any new problems at this time.

## 2020-12-31 NOTE — ED Notes (Signed)
Md at bedside assessing pt

## 2020-12-31 NOTE — Discharge Instructions (Addendum)
Please pick up the antibiotic and potassium from the [facility] and take all of these medications until they are done.  You will need to schedule follow-up with Dr. Dahlia Byes about your gallbladder and Dr. Vicente Males to reschedule your colonoscopy.  If you develop worsening symptoms such as fever, abdominal pain, or vomiting, please return to the ER for reevaluation.

## 2020-12-31 NOTE — ED Notes (Signed)
Pt verbalized understanding of discharge instructions. Pt will make follow up appointment and go to pharmacy to pick up prescription. Pt ambulated to lobby without difficulty. Denies any further questions or concerns at this time.

## 2020-12-31 NOTE — ED Notes (Signed)
Md at bedside discussing discharge with patient

## 2020-12-31 NOTE — ED Triage Notes (Signed)
First Nurse Note:  Arrives to ED stating he needs to be re checked in.  Patient unable to state why he is checking into the ED today.  States was discharged from hospital 2 days ago.  AAOx3.  Skin warm and dry. NAD

## 2020-12-31 NOTE — ED Provider Notes (Signed)
[facility] Emergency Department Provider Note   ____________________________________________   Event Date/Time   First MD Initiated Contact with Patient 12/31/20 1105     (approximate)  I have reviewed the triage vital signs and the nursing notes.   HISTORY  Chief Complaint Abdominal Pain    HPI Dale Jacobs is a 75 y.o. male with past medical history of hypertension, hyperlipidemia, diabetes, GERD, and duodenal neuroendocrine tumor who presents to the ED complaining of abdominal pain.  Patient was admitted to the hospital 3 days ago due to concern for cholecystitis, did have some dark stools at that time as well concerning for GI bleed.  He unfortunately signed out AMA from the hospital yesterday due to his sister's illness, returns to the ED today for reevaluation.  He currently states that he is feeling well, denies any abdominal pain, nausea, vomiting, or fever.  He has not noticed any further blood in his stool and denies any dark tarry stools.  He had unremarkable EGD performed during hospitalization, was undergoing prep for colonoscopy at the time he left AMA.        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   Polyp of duodenum    Polyp of ascending colon    Anemia 09/12/2018   Back pain 09/12/2018   Onychomycosis 09/12/2018   Hyperlipidemia 09/12/2018   Trochanteric bursitis 09/12/2018   Hyperkalemia 09/12/2018   Essential hypertension 09/12/2018   Obesity 09/12/2018   Diabetes mellitus, type 2 ([place]) 09/12/2018    Past Surgical History:  Procedure Laterality Date   BIOPSY  06/20/2019   Procedure: BIOPSY;  Surgeon: Irving Copas., MD;  Location: [facility]  ENDOS[facility];  Service: Gastroenterology;;   COLONOSCOPY WITH PROPOFOL N/A 10/03/2018   Procedure: COLONOSCOPY WITH PROPOFOL;  Surgeon: Lucilla Lame, MD;  Location: [facility] ENDOSCOPY;  Service: Endoscopy;  Laterality: N/A;   ESOPHAGOGASTRODUODENOSCOPY (EGD) WITH PROPOFOL N/A 10/03/2018   Procedure: ESOPHAGOGASTRODUODENOSCOPY (EGD) WITH PROPOFOL;  Surgeon: Lucilla Lame, MD;  Location: [facility] ENDOSCOPY;  Service: Endoscopy;  Laterality: N/A;   ESOPHAGOGASTRODUODENOSCOPY (EGD) WITH PROPOFOL N/A 06/20/2019   Procedure: ESOPHAGOGASTRODUODENOSCOPY (EGD) WITH PROPOFOL;  Surgeon: Rush Landmark Telford Nab., MD;  Location: [facility] ENDOSCOPY;  Service: Gastroenterology;  Laterality: N/A;   ESOPHAGOGASTRODUODENOSCOPY (EGD) WITH PROPOFOL N/A 12/29/2020   Procedure: ESOPHAGOGASTRODUODENOSCOPY (EGD) WITH PROPOFOL;  Surgeon: [name], MD;  Location: [facility] ENDOSCOPY;  Service: Gastroenterology;  Laterality: N/A;   EUS N/A 06/20/2019   Procedure: UPPER ENDOSCOPIC ULTRASOUND (EUS) RADIAL;  Surgeon: Irving Copas., MD;  Location: [facility] ENDOSCOPY;  Service: Gastroenterology;  Laterality: N/A;   GIVENS CAPSULE STUDY N/A 11/06/2018   Procedure: GIVENS CAPSULE STUDY;  Surgeon: Lucilla Lame, MD;  Location: [facility] ENDOSCOPY;  Service: Endoscopy;  Laterality: N/A;   POLYPECTOMY  06/20/2019   Procedure: POLYPECTOMY;  Surgeon: Rush Landmark Telford Nab., MD;  Location: [facility] ENDOSCOPY;  Service: Gastroenterology;;    Prior to Admission medications   Medication Sig Start Date End Date Taking? Authorizing Provider  potassium chloride SA (KLOR-CON) 20 MEQ tablet Take 1 tablet (20 mEq total) by mouth daily for 5 days. 12/31/20 01/05/21 Yes Blake Divine, MD  Accu-Chek FastClix Lancets [place]  04/07/18   [provider]  amLODipine (  NORVASC) 5 MG tablet Take 5 mg by mouth daily. 10/28/20   [provider]  amoxicillin-clavulanate (AUGMENTIN) 875-125 MG tablet Take 1 tablet by mouth 2 (two) times daily for 7 days. 12/31/20 01/07/21   Blake Divine, MD  aspirin EC 81 MG tablet Take 81 mg by mouth daily.    [provider]  atorvastatin (LIPITOR) 40 MG tablet Take 40 mg by mouth at bedtime.     [provider]  cholecalciferol (VITAMIN D3) 25 MCG (1000 UNIT) tablet Take 1,000 Units by mouth daily.    [provider]  ferrous sulfate 325 (65 FE) MG tablet Take 325 mg by mouth 2 (two) times daily.     [provider]  GLIPIZIDE XL 10 MG 24 hr tablet Take 10 mg by mouth 2 (two) times daily.  09/07/18   [provider]  hydrochlorothiazide (HYDRODIURIL) 25 MG tablet Take 25 mg by mouth daily.  09/07/18   [provider]  lisinopril (ZESTRIL) 40 MG tablet Take 40 mg by mouth daily.  08/01/18   [provider]  metFORMIN (GLUCOPHAGE) 500 MG tablet Take 1,000 mg by mouth 2 (two) times daily.  09/07/18   [provider]  omeprazole (PRILOSEC) 40 MG capsule Take 1 capsule (40 mg total) by mouth daily. Take before breakfast or before dinner. 06/20/19 12/28/20  Mansouraty, Telford Nab., MD    Allergies Tradjenta [linagliptin]  Family History  Problem Relation Age of Onset   Heart disease Father    Colon cancer Neg Hx    Liver cancer Neg Hx    Pancreatic cancer Neg Hx    Prostate cancer Neg Hx    Rectal cancer Neg Hx    Stomach cancer Neg Hx    Inflammatory bowel disease Neg Hx    Esophageal cancer Neg Hx     Social History Social History   Tobacco Use   Smoking status: Some Days    Types: Cigars   Smokeless tobacco: Never   Tobacco comments:    not often, "maybe 1  time a month"  Vaping Use   Vaping Use: Never used  Substance Use Topics   Alcohol use: Never   Drug use: Not Currently    Review of Systems  Constitutional: No fever/chills Eyes: No visual changes. ENT: No sore throat. Cardiovascular: Denies chest pain. Respiratory: Denies shortness of breath. Gastrointestinal: Positive for abdominal pain.  No nausea, no vomiting.  No diarrhea.  No  constipation. Genitourinary: Negative for dysuria. Musculoskeletal: Negative for back pain. Skin: Negative for rash. Neurological: Negative for headaches, focal weakness or numbness.  ____________________________________________   PHYSICAL EXAM:  VITAL SIGNS: ED Triage Vitals [12/31/20 1002]  Enc Vitals Group     BP      Pulse      Resp      Temp      Temp src      SpO2      Weight 169 lb 15.6 oz (77.1 kg)     Height 5\' 4"  (1.626 m)     Head Circumference      Peak Flow      Pain Score 0     Pain Loc      Pain Edu?      Excl. in [place]?     Constitutional: Alert and oriented. Eyes: Conjunctivae are normal. Head: Atraumatic. Nose: No congestion/rhinnorhea. Mouth/Throat: Mucous membranes are moist. Neck: Normal ROM Cardiovascular: Normal rate, regular rhythm. Grossly normal heart sounds.  2+ radial pulses  bilaterally. Respiratory: Normal respiratory effort.  No retractions. Lungs CTAB. Gastrointestinal: Soft and nontender. No distention. Genitourinary: deferred Musculoskeletal: No lower extremity tenderness nor edema. Neurologic:  Normal speech and language. No gross focal neurologic deficits are appreciated. Skin:  Skin is warm, dry and intact. No rash noted. Psychiatric: Mood and affect are normal. Speech and behavior are normal.  ____________________________________________   LABS (all labs ordered are listed, but only abnormal results are displayed)  Labs Reviewed  LIPASE, BLOOD - Abnormal; Notable for the following components:      Result Value   Lipase 56 (*)    All other components within normal limits  COMPREHENSIVE METABOLIC PANEL - Abnormal; Notable for the following components:   Potassium 2.8 (*)    Glucose, Bld 219 (*)    Calcium 8.4 (*)    Albumin 3.0 (*)    AST 55 (*)    All other components within normal limits  CBC - Abnormal; Notable for the following components:   RBC 3.42 (*)    Hemoglobin 10.9 (*)    HCT 31.1 (*)    All other components  within normal limits  URINALYSIS, COMPLETE (UACMP) WITH MICROSCOPIC - Abnormal; Notable for the following components:   Color, Urine YELLOW (*)    APPearance CLEAR (*)    Glucose, UA >=500 (*)    Hgb urine dipstick SMALL (*)    Protein, ur 100 (*)    All other components within normal limits    PROCEDURES  Procedure(s) performed (including Critical Care):  Procedures   ____________________________________________   INITIAL IMPRESSION / ASSESSMENT AND PLAN / ED COURSE      75 year old male with possible history of hypertension, hyperlipidemia, diabetes, GERD, and duodenal neuroendocrine tumor who presents to the ED for reevaluation following recent admission for possible cholecystitis, after which patient signed out AMA yesterday.  He currently states he is feeling well and denies any complaints, abdominal exam is benign at this time.  He states he has been able to eat and drink normally at home and denies any fevers.  There was also concern for GI bleed during his admission, although EGD was unremarkable and patient denies any blood in his stool or melena at this time.  Labs are remarkable for hypokalemia, which was also an issue during his admission.  We will replete potassium but H&H is uptrending and I doubt significant GI bleed at this time.  Plan to discuss disposition with general surgery.  Case discussed with Dr. Christian Mate of general surgery, who agrees that patient is appropriate for discharge home with outpatient follow-up.  He recommends follow-up with Dr. Dahlia Byes and patient will also be referred to Dr. Vicente Males of GI to reschedule his colonoscopy.  He remains asymptomatic here in the ED and outpatient follow-up is reasonable.  He was counseled to take the previously prescribed Augmentin, which she has not yet picked up from the pharmacy.  I have resent this prescription as well as additional supplemental potassium.  He was counseled to return to the ED for new worsening symptoms,  patient agrees with plan.      ____________________________________________   FINAL CLINICAL IMPRESSION(S) / ED DIAGNOSES  Final diagnoses:  Generalized abdominal pain  Biliary colic  Hypokalemia     ED Discharge Orders          Ordered    amoxicillin-clavulanate (AUGMENTIN) 875-125 MG tablet  2 times daily        12/31/20 1514    potassium chloride SA (KLOR-CON) 20  MEQ tablet  Daily        12/31/20 1514             Note:  This document was prepared using Dragon voice recognition software and may include unintentional dictation errors.    Blake Divine, MD 12/31/20 [phone]

## 2021-01-01 ENCOUNTER — Ambulatory Visit: Admit: 2021-01-01 | Payer: Medicare (Managed Care) | Admitting: Surgery

## 2021-01-01 SURGERY — CHOLECYSTECTOMY, ROBOT-ASSISTED, LAPAROSCOPIC
Anesthesia: Choice

## 2021-01-02 ENCOUNTER — Encounter: Payer: Self-pay | Admitting: Surgery

## 2021-01-02 LAB — CULTURE, BLOOD (ROUTINE X 2)
Culture: NO GROWTH
Culture: NO GROWTH
Special Requests: ADEQUATE

## 2021-01-02 NOTE — Anesthesia Postprocedure Evaluation (Signed)
Anesthesia Post Note  Patient: Dale Jacobs  Procedure(s) Performed: ESOPHAGOGASTRODUODENOSCOPY (EGD) WITH PROPOFOL  Patient location during evaluation: Endoscopy Anesthesia Type: General Level of consciousness: awake and alert Pain management: pain level controlled Vital Signs Assessment: post-procedure vital signs reviewed and stable Respiratory status: spontaneous breathing, nonlabored ventilation, respiratory function stable and patient connected to nasal cannula oxygen Cardiovascular status: blood pressure returned to baseline and stable Postop Assessment: no apparent nausea or vomiting Anesthetic complications: no   No notable events documented.   Last Vitals:  Vitals:   12/30/20 0749 12/31/20 1000  BP: 134/73 (!) 145/71  Pulse: 86 93  Resp: 16 17  Temp: (!) 36.4 C 37.2 C  SpO2: 96% 92%    Last Pain:  Vitals:   12/31/20 1000  TempSrc: Oral  PainSc:                  [name]

## 2021-01-06 NOTE — Discharge Summary (Addendum)
[facility] SURGICAL DISCHARGE SUMMARY  Patient ID: Dale Jacobs MRN: [id number] DOB/AGE: 1945-08-03 75 y.o.  Admit date: 12/28/2020 Discharge date: 01/06/2021  Discharge Diagnoses Patient Active Problem List   Diagnosis Date Noted   Acute cholecystitis 12/28/2020    Consultants Gastroenterology  Procedures 10/03 Endoscopy  HPI: Dale Jacobs is a 75 y.o. male into the emergency room with a 4-day history of abdominal pain mainly in the right upper quadrant.  The pain is intermittent severe intensity worsening with inspiration.  There is no significant alleviating or aggravating factors.  He reports that he has this before in the past.  He denies any fevers any chills no evidence of biliary obstruction.  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  That he has been on whole and there is being of loss of follow-up.  Does have a white count of 14,000 the rest of the CBC is normal.  CMP was normal other than slight elevation of the BUN to 25.  LFTs.  He did have an ultrasound and CT scan that have personally reviewed showing evidence of a distended gallbladder with pericholecystic fluid consistent with acute cholecystitis.  There is no evidence of biliary dilation.  Gallbladder wall is thick measuring at 5 mm. He is able to perform more than 4 METS of activity without any shortness of breath or chest pain.  No prior major abdominal operations.  Hospital Course: Patient was admitted to general surgery service with plans for robotic assisted laparoscopic cholecystectomy on 10/03 with Dr Dahlia Byes. Unfortunately, he reported melena in pre-operative area and gastroenterology was consulted. He underwent endoscopy on 10/03 with Dr Vicente Males, which was without evidence of bleeding. Plan was for colonoscopy on 10/04. This was ultimately postponed secondary to hypokalemia and patient having PO intake that morning despite being NPO.  Patient then noted he needed to leave the hospital urgently secondary to a family emergency and signed out AMA. I was not able to discuss this with him prior to him signing out, but I was able to send him a Rx for Abx and placed follow up as outpatient to be evaluated for cholecystectomy.   Discharge Condition: Stable  Disposition: AMA     Allergies as of 12/30/2020       Reactions   Tradjenta [linagliptin]    Unknown reaction         Medication List     ASK your doctor about these medications    Accu-Chek FastClix Lancets Misc   amLODipine 5 MG tablet Commonly known as: NORVASC Take 5 mg by mouth daily.   aspirin EC 81 MG tablet Take 81 mg by mouth daily.   atorvastatin 40 MG tablet Commonly known as: LIPITOR Take 40 mg by mouth at bedtime.   cholecalciferol 25 MCG (1000 UNIT) tablet Commonly known as: VITAMIN D3 Take 1,000 Units by mouth daily.   ferrous sulfate 325 (65 FE) MG tablet Take 325 mg by mouth 2 (two) times daily.   glipiZIDE XL 10 MG 24 hr tablet Generic drug: glipiZIDE Take 10 mg by mouth 2 (two) times daily.   hydrochlorothiazide 25 MG tablet Commonly known as: HYDRODIURIL Take 25 mg by mouth daily.   lisinopril 40 MG tablet Commonly known as: ZESTRIL Take 40 mg by mouth daily.   metFORMIN 500 MG tablet Commonly known as: GLUCOPHAGE Take 1,000 mg by mouth 2 (two) times daily.   omeprazole 40 MG capsule Commonly known as: PRILOSEC  Take 1 capsule (40 mg total) by mouth daily. Take before breakfast or before dinner.          Follow-up Information     Pabon, [place] F, MD. Schedule an appointment as soon as possible for a visit in 10 day(s).   Specialty: General Surgery Why: hospital follow up for cholecystitis, signed out [facility] before surgery Contact information: [address] [place] [address] [place] , PA-C [facility]  01/06/2021, 9:57  AM [phone] M-F: 7am - 4pm

## 2021-01-26 ENCOUNTER — Other Ambulatory Visit
Admission: RE | Admit: 2021-01-26 | Discharge: 2021-01-26 | Disposition: A | Discharge status: Home / Self Care | Payer: Medicare (Managed Care) | Source: Home / Self Care | Attending: Surgery | Admitting: Surgery

## 2021-01-26 ENCOUNTER — Emergency Department
Admission: EM | Admit: 2021-01-26 | Discharge: 2021-01-26 | Disposition: A | Discharge status: Home / Self Care | Payer: Medicare (Managed Care) | Attending: Surgery | Admitting: Surgery

## 2021-01-26 ENCOUNTER — Other Ambulatory Visit: Payer: Self-pay

## 2021-01-26 ENCOUNTER — Encounter: Payer: Self-pay | Admitting: Surgery

## 2021-01-26 ENCOUNTER — Ambulatory Visit (INDEPENDENT_AMBULATORY_CARE_PROVIDER_SITE_OTHER): Payer: Medicare (Managed Care) | Admitting: Surgery

## 2021-01-26 VITALS — BP 154/70 | HR 91 | Temp 97.9°F | Ht 64.0 in | Wt 160.6 lb

## 2021-01-26 DIAGNOSIS — K810 Acute cholecystitis: Secondary | ICD-10-CM

## 2021-01-26 DIAGNOSIS — Z5321 Procedure and treatment not carried out due to patient leaving prior to being seen by health care provider: Secondary | ICD-10-CM | POA: Diagnosis present

## 2021-01-26 LAB — COMPREHENSIVE METABOLIC PANEL
ALT: 25 U/L (ref 0–44)
AST: 43 U/L — ABNORMAL HIGH (ref 15–41)
Albumin: 3.9 g/dL (ref 3.5–5.0)
Alkaline Phosphatase: 72 U/L (ref 38–126)
Anion gap: 9 (ref 5–15)
BUN: 15 mg/dL (ref 8–23)
CO2: 26 mmol/L (ref 22–32)
Calcium: 9.9 mg/dL (ref 8.9–10.3)
Chloride: 104 mmol/L (ref 98–111)
Creatinine, Ser: 0.98 mg/dL (ref 0.61–1.24)
GFR, Estimated: >60 mL/min (ref >60)
Glucose, Bld: 70 mg/dL (ref 70–99)
Potassium: 4.6 mmol/L (ref 3.5–5.1)
Sodium: 139 mmol/L (ref 135–145)
Total Bilirubin: 0.6 mg/dL (ref 0.3–1.2)
Total Protein: 7.5 g/dL (ref 6.5–8.1)

## 2021-01-26 LAB — CBC
HCT: 36.9 % — ABNORMAL LOW (ref 39.0–52.0)
Hemoglobin: 12.5 g/dL — ABNORMAL LOW (ref 13.0–17.0)
MCH: 31.5 pg (ref 26.0–34.0)
MCHC: 33.9 g/dL (ref 30.0–36.0)
MCV: 92.9 fL (ref 80.0–100.0)
Platelets: 217 10*3/uL (ref 150–400)
RBC: 3.97 MIL/uL — ABNORMAL LOW (ref 4.22–5.81)
RDW: 13.4 % (ref 11.5–15.5)
WBC: 8.5 10*3/uL (ref 4.0–10.5)
nRBC: 0 % (ref 0.0–0.2)

## 2021-01-26 NOTE — Patient Instructions (Addendum)
Our surgery scheduler Pamala Hurry will call you within 24-48 hours to get you scheduled. If you have not heard from her after 48 hours, please call our office. You will not need to get Covid tested before surgery and have the blue sheet available when she calls to write down important information.  Please go over to [facility] to have your labs drawn.    If you have any concerns or questions, please feel free to call our office.  Cholelithiasis Cholelithiasis happens when gallstones form in the gallbladder. The gallbladder stores bile. Bile is a fluid that helps digest fats. Bile can harden and form into gallstones. If they cause a blockage, they can cause pain (gallbladder attack). What are the causes? This condition may be caused by: Some blood diseases, such as sickle cell anemia. Too much of a fat-like substance (cholesterol) in your bile. Not enough bile salts in your bile. These salts help the body absorb and digest fats. The gallbladder not emptying fully or often enough. This is common in pregnant women. What increases the risk? The following factors may make you more likely to develop this condition: Being male. Being pregnant many times. Eating a lot of fried foods, fat, and refined carbs (refined carbohydrates). Being very overweight (obese). Being older than age 54. Using medicines with male hormones in them for a long time. Losing weight fast. Having gallstones in your family. Having some health problems, such as diabetes, Crohn's disease, or liver disease. What are the signs or symptoms? Often, there may be gallstones but no symptoms. These gallstones are called silent gallstones. If a gallstone causes a blockage, you may get sudden pain. The pain: Can be in the upper right part of your belly (abdomen). Normally comes at night or after you eat. Can last an hour or more. Can spread to your right shoulder, back, or chest. Can feel like discomfort, burning, or  fullness in the upper part of your belly (indigestion). If the blockage lasts more than a few hours, you can get an infection or swelling. You may: Feel like you may vomit. Vomit. Feel bloated. Have belly pain for 5 hours or more. Feel tender in your belly, often in the upper right part and under your ribs. Have fever or chills. Have skin or the white parts of your eyes turn yellow (jaundice). Have dark pee (urine) or pale poop (stool). How is this treated? Treatment for this condition depends on how bad you feel. If you have symptoms, you may need: Home care, if symptoms are not very bad. Do not eat for 12-24 hours. Drink only water and clear liquids. Start to eat simple or clear foods after 1 or 2 days. Try broths and crackers. You may need medicines for pain or stomach upset or both. If you have an infection, you will need antibiotics. A hospital stay, if you have very bad pain or a very bad infection. Surgery to remove your gallbladder. You may need this if: Gallstones keep coming back. You have very bad symptoms. Medicines to break up gallstones. Medicines: Are best for small gallstones. May be used for up to 6-12 months. A procedure to find and take out gallstones or to break up gallstones. Follow these instructions at home: Medicines Take over-the-counter and prescription medicines only as told by your doctor. If you were prescribed an antibiotic medicine, take it as told by your doctor. Do not stop taking the antibiotic even if you start to feel better. Ask your doctor if  the medicine prescribed to you requires you to avoid driving or using machinery. Eating and drinking Drink enough fluid to keep your urine pale yellow. Drink water or clear fluids. This is important when you have pain. Eat healthy foods. Choose: Fewer fatty foods, such as fried foods. Fewer refined carbs. Avoid breads and grains that are highly processed, such as white bread and white rice. Choose whole  grains, such as whole-wheat bread and brown rice. More fiber. Almonds, fresh fruit, and beans are healthy sources. General instructions Keep a healthy weight. Keep all follow-up visits as told by your doctor. This is important. Where to find more information [employer] of Diabetes and Digestive and Kidney Diseases: [email or web address] Contact a doctor if: You have sudden pain in the upper right part of your belly. Pain might spread to your right shoulder, back, or chest. You have been diagnosed with gallstones that have no symptoms and you get: Belly pain. Discomfort, burning, or fullness in the upper part of your abdomen. You have dark urine or pale stools. Get help right away if: You have sudden pain in the upper right part of your abdomen, and the pain lasts more than 2 hours. You have pain in your abdomen, and: It lasts more than 5 hours. It keeps getting worse. You have a fever or chills. You keep feeling like you may vomit. You keep vomiting. Your skin or the white parts of your eyes turn yellow. Summary Cholelithiasis happens when gallstones form in the gallbladder. This condition may be caused by a blood disease, too much of a fat-like substance in the bile, or not enough bile salts in bile. Treatment for this condition depends on how bad you feel. If you have symptoms, do not eat or drink. You may need medicines. You may need a hospital stay for very bad pain or a very bad infection. You may need surgery if gallstones keep coming back or if you have very bad symptoms. This information is not intended to replace advice given to you by your health care provider. Make sure you discuss any questions you have with your health care provider. Document Revised: 05/04/2019 Document Reviewed: 02/05/2019 Elsevier Patient Education  2022 [employer].

## 2021-01-27 ENCOUNTER — Telehealth: Payer: Self-pay

## 2021-01-27 NOTE — Telephone Encounter (Signed)
Call to patient to let him know that his lab work is normal. Unable to leave a message.

## 2021-01-27 NOTE — Telephone Encounter (Signed)
-----   Message from Jules Husbands, MD sent at 01/27/2021 10:02 AM EDT ----- Please let him know lab work was nml ----- Message ----- From: Buel Ream, Lab In [place] Sent: 01/26/2021   4:09 PM EDT To: Jules Husbands, MD

## 2021-01-28 ENCOUNTER — Encounter: Payer: Self-pay | Admitting: Surgery

## 2021-01-28 ENCOUNTER — Telehealth: Payer: Self-pay | Admitting: Surgery

## 2021-01-28 NOTE — Telephone Encounter (Signed)
Patient has been informed that [place] is out of network with his health insurance, [employer].  Patient is made aware that if we schedule surgery that there is a good chance is insurance will pay little to nothing.  Patient wishes to go where his insurance is accepted.  He is requesting referral to Dr. Riley Lam in [place], [place].  Referral is placed at patient's request.  Patient verbalized understanding with the above.

## 2021-01-28 NOTE — Progress Notes (Signed)
Outpatient Surgical Follow Up  01/28/2021  Dale Jacobs is an 75 y.o. male.   Chief Complaint  Patient presents with   New Patient (Initial Visit)    Cholelithiasis     HPI: 75 year old male with a recent history of cholecystitis that was managed with antibiotics.  The patient have to leave AMA due to a family emergency.  He apparently did have a questionable episode of GI bleed and Dr. Vicente Males perform an EGD showing no evidence of active bleeding.  It was thought that the questionable melena would have been because by iron pills.  He currently denies any abdominal pain no fevers no chills he denies any shortness of breath or chest pain.  He denies any melena or any hematochezia. He did have a CT scan as well as ultrasound that I personally reviewed showing evidence of cholelithiasis and early cholecystitis.  There was small nodules in the liver.  Labs show some mild anemia CMP is normal INR is normal  Past Medical History:  Diagnosis Date   Diabetes mellitus without complication (HCC)    Type II   GERD (gastroesophageal reflux disease)    Hyperlipemia    Hypertension     Past Surgical History:  Procedure Laterality Date   BIOPSY  06/20/2019   Procedure: BIOPSY;  Surgeon: Irving Copas., MD;  Location: [facility];  Service: Gastroenterology;;   COLONOSCOPY WITH PROPOFOL N/A 10/03/2018   Procedure: COLONOSCOPY WITH PROPOFOL;  Surgeon: Lucilla Lame, MD;  Location: [facility] ENDOSCOPY;  Service: Endoscopy;  Laterality: N/A;   ESOPHAGOGASTRODUODENOSCOPY (EGD) WITH PROPOFOL N/A 10/03/2018   Procedure: ESOPHAGOGASTRODUODENOSCOPY (EGD) WITH PROPOFOL;  Surgeon: Lucilla Lame, MD;  Location: [facility] ENDOSCOPY;  Service: Endoscopy;  Laterality: N/A;   ESOPHAGOGASTRODUODENOSCOPY (EGD) WITH PROPOFOL N/A 06/20/2019   Procedure: ESOPHAGOGASTRODUODENOSCOPY (EGD) WITH PROPOFOL;  Surgeon: Rush Landmark Telford Nab., MD;  Location: [facility] ENDOSCOPY;  Service: Gastroenterology;  Laterality: N/A;    ESOPHAGOGASTRODUODENOSCOPY (EGD) WITH PROPOFOL N/A 12/29/2020   Procedure: ESOPHAGOGASTRODUODENOSCOPY (EGD) WITH PROPOFOL;  Surgeon: [name], MD;  Location: [facility] ENDOSCOPY;  Service: Gastroenterology;  Laterality: N/A;   EUS N/A 06/20/2019   Procedure: UPPER ENDOSCOPIC ULTRASOUND (EUS) RADIAL;  Surgeon: Irving Copas., MD;  Location: [facility] ENDOSCOPY;  Service: Gastroenterology;  Laterality: N/A;   GIVENS CAPSULE STUDY N/A 11/06/2018   Procedure: GIVENS CAPSULE STUDY;  Surgeon: Lucilla Lame, MD;  Location: [facility] ENDOSCOPY;  Service: Endoscopy;  Laterality: N/A;   POLYPECTOMY  06/20/2019   Procedure: POLYPECTOMY;  Surgeon: Irving Copas., MD;  Location: [facility] ENDOSCOPY;  Service: Gastroenterology;;    Family History  Problem Relation Age of Onset   Heart disease Father    Colon cancer Neg Hx    Liver cancer Neg Hx    Pancreatic cancer Neg Hx    Prostate cancer Neg Hx    Rectal cancer Neg Hx    Stomach cancer Neg Hx    Inflammatory bowel disease Neg Hx    Esophageal cancer Neg Hx     Social History:  reports that he has been smoking cigars. He has never used smokeless tobacco. He reports that he does not currently use drugs. He reports that he does not drink alcohol.  Allergies:  Allergies  Allergen Reactions   Tradjenta [Linagliptin]     Unknown reaction     Medications reviewed.    ROS Full ROS performed and is otherwise negative other than what is stated in HPI   BP (!) 154/70   Pulse 91   Temp 97.9 F (  36.6 C) (Oral)   Ht 5\' 4"  (1.626 m)   Wt 160 lb 9.6 oz (72.8 kg)   SpO2 96%   BMI 27.57 kg/m   Physical Exam Vitals and nursing note reviewed. Exam conducted with a chaperone present.  Constitutional:      Appearance: Normal appearance.  Eyes:     General:        Right eye: No discharge.        Left eye: No discharge.  Cardiovascular:     Rate and Rhythm: Normal rate and regular rhythm.     Heart sounds: No murmur heard. Pulmonary:     Effort:  Pulmonary effort is normal. No respiratory distress.     Breath sounds: Normal breath sounds. No stridor.  Abdominal:     General: Abdomen is flat. There is no distension.     Palpations: Abdomen is soft. There is no mass.     Tenderness: There is no abdominal tenderness. There is no guarding or rebound.     Hernia: No hernia is present.  Musculoskeletal:        General: No swelling. Normal range of motion.     Cervical back: Normal range of motion and neck supple. No rigidity or tenderness.  Skin:    General: Skin is warm and dry.     Capillary Refill: Capillary refill takes less than 2 seconds.  Neurological:     General: No focal deficit present.     Mental Status: He is alert and oriented to person, place, and time.  Psychiatric:        Mood and Affect: Mood normal.        Behavior: Behavior normal.        Thought Content: Thought content normal.        Judgment: Judgment normal.       Assessment/Plan:  75 year old male with history of cholecystitis treated medically.  He is much better.  At this time I definitely recommend cholecystectomy.  Before proceeding with cholecystectomy we will make sure that his hemoglobin and CMP appropriate. Is interested in undergoing cholecystectomy.I discussed the procedure in detail.  The patient was given Neurosurgeon.  We discussed the risks and benefits of a laparoscopic cholecystectomy and possible cholangiogram including, but not limited to bleeding, infection, injury to surrounding structures such as the intestine or liver, bile leak, retained gallstones, need to convert to an open procedure, prolonged diarrhea, blood clots such as  DVT, common bile duct injury, anesthesia risks, and possible need for additional procedures.  The likelihood of improvement in symptoms and return to the patient's normal status is good. We discussed the typical post-operative recovery course.    Greater than 50% of the 40 minutes  visit was spent in  counseling/coordination of care   Caroleen Hamman, MD [place] Surgeon

## 2023-10-13 ENCOUNTER — Ambulatory Visit: Payer: Self-pay | Admitting: Podiatry

## 2023-10-20 ENCOUNTER — Ambulatory Visit: Admitting: Podiatry
